# Patient Record
Sex: Female | Born: 1970
Health system: Southern US, Community
[De-identification: ages and names within clinical notes are randomized; demographics above are authoritative.]

## PROBLEM LIST (undated history)

## (undated) DIAGNOSIS — I10 Essential (primary) hypertension: Secondary | ICD-10-CM

## (undated) DIAGNOSIS — Z9889 Other specified postprocedural states: Secondary | ICD-10-CM

## (undated) DIAGNOSIS — E785 Hyperlipidemia, unspecified: Secondary | ICD-10-CM

## (undated) DIAGNOSIS — R112 Nausea with vomiting, unspecified: Secondary | ICD-10-CM

## (undated) DIAGNOSIS — R011 Cardiac murmur, unspecified: Secondary | ICD-10-CM

## (undated) DIAGNOSIS — Z8739 Personal history of other diseases of the musculoskeletal system and connective tissue: Secondary | ICD-10-CM

## (undated) DIAGNOSIS — N200 Calculus of kidney: Secondary | ICD-10-CM

## (undated) HISTORY — PX: NEPHROLITHOTOMY: SUR881

## (undated) HISTORY — DX: Calculus of kidney: N20.0

## (undated) HISTORY — DX: Essential (primary) hypertension: I10

## (undated) HISTORY — DX: Hyperlipidemia, unspecified: E78.5

## (undated) HISTORY — PX: CHOLECYSTECTOMY: SHX55

## (undated) HISTORY — DX: Personal history of other diseases of the musculoskeletal system and connective tissue: Z87.39

## (undated) HISTORY — PX: SHOULDER SURGERY: SHX246

## (undated) HISTORY — PX: PARTIAL HYSTERECTOMY: SHX80

---

## 2009-12-02 DIAGNOSIS — Z90710 Acquired absence of both cervix and uterus: Secondary | ICD-10-CM | POA: Insufficient documentation

## 2010-04-07 DIAGNOSIS — R748 Abnormal levels of other serum enzymes: Secondary | ICD-10-CM | POA: Insufficient documentation

## 2010-04-07 DIAGNOSIS — E781 Pure hyperglyceridemia: Secondary | ICD-10-CM | POA: Insufficient documentation

## 2010-04-30 DIAGNOSIS — E559 Vitamin D deficiency, unspecified: Secondary | ICD-10-CM | POA: Insufficient documentation

## 2010-05-05 DIAGNOSIS — K760 Fatty (change of) liver, not elsewhere classified: Secondary | ICD-10-CM | POA: Insufficient documentation

## 2013-04-17 DIAGNOSIS — M722 Plantar fascial fibromatosis: Secondary | ICD-10-CM | POA: Insufficient documentation

## 2014-07-25 DIAGNOSIS — K219 Gastro-esophageal reflux disease without esophagitis: Secondary | ICD-10-CM | POA: Insufficient documentation

## 2017-10-07 DIAGNOSIS — R87810 Cervical high risk human papillomavirus (HPV) DNA test positive: Secondary | ICD-10-CM | POA: Insufficient documentation

## 2017-10-07 DIAGNOSIS — I10 Essential (primary) hypertension: Secondary | ICD-10-CM | POA: Insufficient documentation

## 2018-02-20 ENCOUNTER — Emergency Department
Admission: EM | Admit: 2018-02-20 | Discharge: 2018-02-20 | Disposition: A | Discharge status: Home / Self Care | Payer: 59 | Attending: Emergency Medicine | Admitting: Emergency Medicine

## 2018-02-20 ENCOUNTER — Other Ambulatory Visit: Payer: Self-pay

## 2018-02-20 DIAGNOSIS — R109 Unspecified abdominal pain: Secondary | ICD-10-CM

## 2018-02-20 DIAGNOSIS — R1032 Left lower quadrant pain: Secondary | ICD-10-CM | POA: Insufficient documentation

## 2018-02-20 DIAGNOSIS — Z9049 Acquired absence of other specified parts of digestive tract: Secondary | ICD-10-CM | POA: Diagnosis not present

## 2018-02-20 DIAGNOSIS — F17200 Nicotine dependence, unspecified, uncomplicated: Secondary | ICD-10-CM | POA: Diagnosis not present

## 2018-02-20 LAB — URINALYSIS, COMPLETE (UACMP) WITH MICROSCOPIC
BILIRUBIN URINE: NEGATIVE
Bacteria, UA: NONE SEEN
GLUCOSE, UA: NEGATIVE mg/dL
HGB URINE DIPSTICK: NEGATIVE
KETONES UR: NEGATIVE mg/dL
NITRITE: NEGATIVE
PH: 6 (ref 5.0–8.0)
Protein, ur: 30 mg/dL — AB
SPECIFIC GRAVITY, URINE: 1.031 — AB (ref 1.005–1.030)

## 2018-02-20 LAB — COMPREHENSIVE METABOLIC PANEL
ALBUMIN: 4 g/dL (ref 3.5–5.0)
ALT: 24 U/L (ref 0–44)
ANION GAP: 8 (ref 5–15)
AST: 16 U/L (ref 15–41)
Alkaline Phosphatase: 70 U/L (ref 38–126)
BILIRUBIN TOTAL: 0.3 mg/dL (ref 0.3–1.2)
BUN: 13 mg/dL (ref 6–20)
CALCIUM: 9 mg/dL (ref 8.9–10.3)
CO2: 28 mmol/L (ref 22–32)
Chloride: 106 mmol/L (ref 98–111)
Creatinine, Ser: 0.54 mg/dL (ref 0.44–1.00)
GFR calc non Af Amer: >60 mL/min (ref >60)
GLUCOSE: 117 mg/dL — AB (ref 70–99)
POTASSIUM: 3.5 mmol/L (ref 3.5–5.1)
Sodium: 142 mmol/L (ref 135–145)
TOTAL PROTEIN: 7.6 g/dL (ref 6.5–8.1)

## 2018-02-20 LAB — CBC WITH DIFFERENTIAL/PLATELET
Abs Immature Granulocytes: 0.02 10*3/uL (ref 0.00–0.07)
BASOS ABS: 0 10*3/uL (ref 0.0–0.1)
Basophils Relative: 0 %
EOS PCT: 2 %
Eosinophils Absolute: 0.1 10*3/uL (ref 0.0–0.5)
HEMATOCRIT: 38.4 % (ref 36.0–46.0)
Hemoglobin: 12.7 g/dL (ref 12.0–15.0)
Immature Granulocytes: 0 %
LYMPHS ABS: 2.3 10*3/uL (ref 0.7–4.0)
Lymphocytes Relative: 38 %
MCH: 30.2 pg (ref 26.0–34.0)
MCHC: 33.1 g/dL (ref 30.0–36.0)
MCV: 91.2 fL (ref 80.0–100.0)
MONO ABS: 0.4 10*3/uL (ref 0.1–1.0)
Monocytes Relative: 6 %
NRBC: 0 % (ref 0.0–0.2)
Neutro Abs: 3.1 10*3/uL (ref 1.7–7.7)
Neutrophils Relative %: 54 %
Platelets: 278 10*3/uL (ref 150–400)
RBC: 4.21 MIL/uL (ref 3.87–5.11)
RDW: 11.9 % (ref 11.5–15.5)
WBC: 5.9 10*3/uL (ref 4.0–10.5)

## 2018-02-20 LAB — POCT PREGNANCY, URINE: Preg Test, Ur: NEGATIVE

## 2018-02-20 MED ORDER — TAMSULOSIN HCL 0.4 MG PO CAPS: 0.4000 mg | ORAL_CAPSULE | Freq: Every day | ORAL | 0 refills | Status: DC

## 2018-02-20 MED ORDER — ONDANSETRON HCL 4 MG PO TABS: 4.0000 mg | ORAL_TABLET | Freq: Three times a day (TID) | ORAL | 0 refills | Status: DC | PRN

## 2018-02-20 MED ORDER — KETOROLAC TROMETHAMINE 60 MG/2ML IM SOLN
60.0000 mg | Freq: Once | INTRAMUSCULAR | Status: AC
  Administered 2018-02-20: 60 mg via INTRAMUSCULAR
  Filled 2018-02-20: qty 2

## 2018-02-20 MED ORDER — KETOROLAC TROMETHAMINE 10 MG PO TABS: 10.0000 mg | ORAL_TABLET | Freq: Three times a day (TID) | ORAL | 0 refills | Status: DC | PRN

## 2018-02-20 NOTE — ED Notes (Signed)

## 2018-02-20 NOTE — Discharge Instructions (Signed)
Please seek medical attention for any high fevers, chest pain, shortness of breath, change in behavior, persistent vomiting, bloody stool or any other new or concerning symptoms.  

## 2018-02-20 NOTE — ED Provider Notes (Signed)
[facility] Emergency Department Provider Note  ____________________________________________   I have reviewed the triage vital signs and the nursing notes.   HISTORY  Chief Complaint Back Pain   History limited by: Not Limited   HPI Caroline Turner is a 47 y.o. female who presents to the emergency department today because of concerns for back pain and left flank pain and concern for possible kidney stone.  Patient states that she had back pain for roughly 1 week.  This had been accompanied by nausea.  More recently the pain has now localized more in the left flank.  She states it does remind her when she has had kidney stones in the past.  The patient denies any fevers or chills with this.  She denies any bad odor to her urine or dysuria.  Per medical record review patient has a history of cholecystectomy  History reviewed. No pertinent past medical history.  There are no active problems to display for this patient.   Past Surgical History:  Procedure Laterality Date  . CHOLECYSTECTOMY      Prior to Admission medications   Not on File    Allergies Patient has no known allergies.  No family history on file.  Social History Social History   Tobacco Use  . Smoking status: Current Every Day Smoker  . Smokeless tobacco: Never Used  Substance Use Topics  . Alcohol use: Yes  . Drug use: Never    Review of Systems Constitutional: No fever/chills Eyes: No visual changes. ENT: No sore throat. Cardiovascular: Denies chest pain. Respiratory: Denies shortness of breath. Gastrointestinal: No abdominal pain. Positive for nausea.  Genitourinary: Negative for dysuria. Musculoskeletal: Positive for back pain. Skin: Negative for rash. Neurological: Negative for headaches, focal weakness or numbness.  ____________________________________________   PHYSICAL EXAM:  VITAL SIGNS: ED Triage Vitals  Enc Vitals Group     BP 02/20/18 1232 (!) 164/110    Pulse Rate 02/20/18 1232 70     Resp 02/20/18 1232 18     Temp 02/20/18 1232 97.6 F (36.4 C)     Temp Source 02/20/18 1605 Oral     SpO2 02/20/18 1232 94 %     Weight 02/20/18 1230 220 lb (99.8 kg)     Height 02/20/18 1230 5\' 3"  (1.6 m)     Head Circumference --      Peak Flow --      Pain Score 02/20/18 1230 10   Constitutional: Alert and oriented.  Eyes: Conjunctivae are normal.  ENT      Head: Normocephalic and atraumatic.      Nose: No congestion/rhinnorhea.      Mouth/Throat: Mucous membranes are moist.      Neck: No stridor. Hematological/Lymphatic/Immunilogical: No cervical lymphadenopathy. Cardiovascular: Normal rate, regular rhythm.  No murmurs, rubs, or gallops.  Respiratory: Normal respiratory effort without tachypnea nor retractions. Breath sounds are clear and equal bilaterally. No wheezes/rales/rhonchi. Gastrointestinal: Soft and non tender. No rebound. No guarding.  Genitourinary: Deferred Musculoskeletal: Normal range of motion in all extremities. No lower extremity edema. Neurologic:  Normal speech and language. No gross focal neurologic deficits are appreciated.  Skin:  Skin is warm, dry and intact. No rash noted. Psychiatric: Mood and affect are normal. Speech and behavior are normal. Patient exhibits appropriate insight and judgment.  ____________________________________________    LABS (pertinent positives/negatives)  UA leukocytes small, rbc 6-10, squamous 21-50 Upreg negative CBC wnl CMP wnl except glu 117  ____________________________________________   EKG  None  ____________________________________________    RADIOLOGY  None  ____________________________________________   PROCEDURES  Procedures  ____________________________________________   INITIAL IMPRESSION / ASSESSMENT AND PLAN / ED COURSE  Pertinent labs & imaging results that were available during my care of the patient were reviewed by me and considered in my medical  decision making (see chart for details).   Patient presented to the emergency department today because of concerns for back pain, left flank pain and possible kidney stone.  Patient states she has a history of kidney stones.  Blood work and urinalysis does show some red blood cells in the urine.  No leukocytosis in the blood.  At this point do think kidney stones are likely.  I had a discussion with the patient.  Given that this reminds her of her previous kidney stones and that she does have some red blood cells in the urine discussed empiric treatment of kidney stones.  Discussed possibility of imaging however patient was comfortable deferring at this time.  Will plan on discharging with medications for kidney stones.  She did feel better after Toradol administered in the emergency department.   ____________________________________________   FINAL CLINICAL IMPRESSION(S) / ED DIAGNOSES  Final diagnoses:  Left flank pain     Note: This dictation was prepared with Dragon dictation. Any transcriptional errors that result from this process are unintentional     Phineas Semen, MD 02/21/18 2003

## 2018-02-20 NOTE — ED Triage Notes (Signed)
Pt c/o back pain and nausea x1 week.

## 2018-02-24 ENCOUNTER — Ambulatory Visit
Admission: RE | Admit: 2018-02-24 | Discharge: 2018-02-24 | Disposition: A | Discharge status: Home / Self Care | Payer: 59 | Source: Ambulatory Visit | Attending: Urology | Admitting: Urology

## 2018-02-24 ENCOUNTER — Ambulatory Visit (INDEPENDENT_AMBULATORY_CARE_PROVIDER_SITE_OTHER): Payer: 59 | Admitting: Urology

## 2018-02-24 ENCOUNTER — Encounter: Payer: Self-pay | Admitting: Urology

## 2018-02-24 VITALS — BP 137/88 | HR 69 | Ht 63.0 in | Wt 225.2 lb

## 2018-02-24 DIAGNOSIS — Z9049 Acquired absence of other specified parts of digestive tract: Secondary | ICD-10-CM | POA: Diagnosis not present

## 2018-02-24 DIAGNOSIS — R109 Unspecified abdominal pain: Secondary | ICD-10-CM

## 2018-02-24 DIAGNOSIS — R350 Frequency of micturition: Secondary | ICD-10-CM | POA: Diagnosis not present

## 2018-02-24 DIAGNOSIS — R310 Gross hematuria: Secondary | ICD-10-CM | POA: Insufficient documentation

## 2018-02-24 DIAGNOSIS — K5730 Diverticulosis of large intestine without perforation or abscess without bleeding: Secondary | ICD-10-CM | POA: Insufficient documentation

## 2018-02-24 DIAGNOSIS — N200 Calculus of kidney: Secondary | ICD-10-CM | POA: Diagnosis not present

## 2018-02-24 LAB — MICROSCOPIC EXAMINATION
RBC MICROSCOPIC, UA: NONE SEEN /HPF (ref 0–2)
WBC UA: NONE SEEN /HPF (ref 0–5)

## 2018-02-24 LAB — URINALYSIS, COMPLETE
BILIRUBIN UA: NEGATIVE
GLUCOSE, UA: NEGATIVE
KETONES UA: NEGATIVE
Leukocytes, UA: NEGATIVE
NITRITE UA: NEGATIVE
Protein, UA: NEGATIVE
RBC, UA: NEGATIVE
SPEC GRAV UA: 1.02 (ref 1.005–1.030)
Urobilinogen, Ur: 0.2 mg/dL (ref 0.2–1.0)
pH, UA: 6 (ref 5.0–7.5)

## 2018-02-24 MED ORDER — IOPAMIDOL (ISOVUE-300) INJECTION 61%
125.0000 mL | Freq: Once | INTRAVENOUS | Status: AC | PRN
  Administered 2018-02-24: 125 mL via INTRAVENOUS

## 2018-02-24 MED ORDER — KETOROLAC TROMETHAMINE 60 MG/2ML IM SOLN
60.0000 mg | Freq: Once | INTRAMUSCULAR | Status: AC
  Administered 2018-02-24: 60 mg via INTRAMUSCULAR

## 2018-02-24 MED ORDER — PROMETHAZINE HCL 25 MG/ML IJ SOLN
25.0000 mg | Freq: Once | INTRAMUSCULAR | Status: AC
  Administered 2018-02-24: 25 mg via INTRAMUSCULAR

## 2018-02-24 MED ORDER — PROMETHAZINE HCL 25 MG PO TABS: 25.0000 mg | ORAL_TABLET | Freq: Four times a day (QID) | ORAL | 0 refills | Status: DC | PRN

## 2018-02-24 MED ORDER — TRAMADOL HCL 50 MG PO TABS: 50.0000 mg | ORAL_TABLET | Freq: Four times a day (QID) | ORAL | 0 refills | Status: DC | PRN

## 2018-02-24 NOTE — Progress Notes (Signed)
02/24/2018 12:49 PM   Caroline Turner Nov 05, 1970 [id number]  Referring provider: No referring provider defined for this encounter.  Chief Complaint  Patient presents with  . Nephrolithiasis  . Follow-up    HPI: Patient is a 47 year old Caucasian female who was referred by [facility] ED for nephrolithiasis.    She presented to [facility] ED on February 12, 2018 with a complaints of left flank pain for 1 week associated with nausea.  No imaging studies were performed.   Labs in the ED: UA positive for 6-10 RBCs and 0-5 WBCs, serum creatinine was 0.54 and WBC count 5.9   Meds given in the ED: Toradol, Zofran and Flomax  Prior urological history:  She has had ureteroscopy x 1   Current NSAID/anticoagulation:  Toradol    Today, she is having left flank pain (8/10), frequency, dysuria, nocturia and blood in the urine.  She is having nausea and vomiting.  Patient denies any gross hematuria, dysuria or suprapubic/flank pain.  Patient denies any fever and chills.  Her UA is negative.    STAT CTU on 02/24/2018 revealed the adrenal glands are unremarkable.  Small bilateral midpole renal calculi but no obstructing ureteral calculi or bladder calculi. Both kidneys demonstrate normal enhancement/perfusion. No worrisome renal lesions. On the delayed images there is a wedge-shaped area of contrast enhancement involving the mid to lower pole region of the left kidney medially.  On the coronal images this appears to be some type of vascular shunt, possibly a venous varix. The delayed images do not demonstrate any significant collecting system abnormalities. Both ureters are normal. The bladder is normal. No asymmetric bladder wall thickening or bladder mass.    PMH: Past Medical History:  Diagnosis Date  . History of swelling of feet   . Hypertension   . Kidney stones     Surgical History: Past Surgical History:  Procedure Laterality Date  . CHOLECYSTECTOMY    . CHOLECYSTECTOMY    .  NEPHROLITHOTOMY    . PARTIAL HYSTERECTOMY    . SHOULDER SURGERY Left     Home Medications:  Allergies as of 02/24/2018   No Known Allergies     Medication List        Accurate as of 02/24/18 12:49 PM. Always use your most recent med list.          hydrochlorothiazide 25 MG tablet Commonly known as:  HYDRODIURIL Take 25 mg by mouth daily.   ketorolac 10 MG tablet Commonly known as:  TORADOL Take 1 tablet (10 mg total) by mouth every 8 (eight) hours as needed for severe pain.   ondansetron 4 MG tablet Commonly known as:  ZOFRAN Take 1 tablet (4 mg total) by mouth every 8 (eight) hours as needed for nausea or vomiting.   promethazine 25 MG tablet Commonly known as:  PHENERGAN Take 1 tablet (25 mg total) by mouth every 6 (six) hours as needed for nausea or vomiting.   tamsulosin 0.4 MG Caps capsule Commonly known as:  FLOMAX Take 1 capsule (0.4 mg total) by mouth daily.   traMADol 50 MG tablet Commonly known as:  ULTRAM Take 1 tablet (50 mg total) by mouth every 6 (six) hours as needed.       Allergies: No Known Allergies  Family History: Family History  Problem Relation Age of Onset  . Cancer Father     Social History:  reports that she has been smoking. She has been smoking about 0.25 packs per day. She has never  used smokeless tobacco. She reports that she drinks about 1.0 standard drinks of alcohol per week. She reports that she does not use drugs.  ROS: UROLOGY Frequent Urination?: Yes Hard to postpone urination?: No Burning/pain with urination?: Yes Get up at night to urinate?: Yes Leakage of urine?: No Urine stream starts and stops?: No Do you have to strain to urinate?: No Blood in urine?: Yes Urinary tract infection?: No Sexually transmitted disease?: No Injury to kidneys or bladder?: No Painful intercourse?: No Weak stream?: No Currently pregnant?: No Vaginal bleeding?: No Last menstrual period?: n  Gastrointestinal Nausea?:  No Vomiting?: Yes Indigestion/heartburn?: No Diarrhea?: Yes Constipation?: No  Constitutional Fever: No Night sweats?: No Weight loss?: No Fatigue?: No  Skin Skin rash/lesions?: No Itching?: No  Eyes Blurred vision?: No Double vision?: No  Ears/Nose/Throat Sore throat?: No Sinus problems?: No  Hematologic/Lymphatic Swollen glands?: No Easy bruising?: No  Cardiovascular Leg swelling?: No Chest pain?: No  Respiratory Cough?: No Shortness of breath?: No  Endocrine Excessive thirst?: No  Musculoskeletal Back pain?: No Joint pain?: No  Neurological Headaches?: No Dizziness?: No  Psychologic Depression?: No Anxiety?: No  Physical Exam: BP 137/88 (BP Location: Left Arm, Patient Position: Sitting, Cuff Size: Large)   Pulse 69   Ht 5\' 3"  (1.6 m)   Wt 225 lb 3.2 oz (102.2 kg)   BMI 39.89 kg/m   Constitutional:  Well nourished. Alert and oriented, No acute distress.  Writhing in pain. HEENT: [place] AT, moist mucus membranes.  Trachea midline, no masses. Cardiovascular: No clubbing, cyanosis, or edema. Respiratory: Normal respiratory effort, no increased work of breathing. GI: Abdomen is soft, non tender, non distended, no abdominal masses. Liver and spleen not palpable.  No hernias appreciated.  Stool sample for occult testing is not indicated.   GU: No CVA tenderness.  No bladder fullness or masses.   Skin: No rashes, bruises or suspicious lesions. Lymph: No cervical or inguinal adenopathy. Neurologic: Grossly intact, no focal deficits, moving all 4 extremities. Psychiatric: Normal mood and affect.  Laboratory Data: Lab Results  Component Value Date   WBC 5.9 02/20/2018   HGB 12.7 02/20/2018   HCT 38.4 02/20/2018   MCV 91.2 02/20/2018   PLT 278 02/20/2018    Lab Results  Component Value Date   CREATININE 0.54 02/20/2018    No results found for: PSA  No results found for: TESTOSTERONE  No results found for: HGBA1C  No results found for:  TSH  No results found for: CHOL, HDL, CHOLHDL, VLDL, LDLCALC  Lab Results  Component Value Date   AST 16 02/20/2018   Lab Results  Component Value Date   ALT 24 02/20/2018   No components found for: ALKALINEPHOPHATASE No components found for: BILIRUBINTOTAL  No results found for: ESTRADIOL  Urinalysis    Component Value Date/Time   COLORURINE YELLOW (A) 02/20/2018 1601   APPEARANCEUR HAZY (A) 02/20/2018 1601   LABSPEC 1.031 (H) 02/20/2018 1601   PHURINE 6.0 02/20/2018 1601   GLUCOSEU NEGATIVE 02/20/2018 1601   HGBUR NEGATIVE 02/20/2018 1601   BILIRUBINUR NEGATIVE 02/20/2018 1601   KETONESUR NEGATIVE 02/20/2018 1601   PROTEINUR 30 (A) 02/20/2018 1601   NITRITE NEGATIVE 02/20/2018 1601   LEUKOCYTESUR SMALL (A) 02/20/2018 1601   I have reviewed the labs.   Pertinent Imaging: CLINICAL DATA:  Left flank pain, frequent urination and microscopic hematuria for 1 week.  EXAM: CT ABDOMEN AND PELVIS WITHOUT AND WITH CONTRAST  TECHNIQUE: Multidetector CT imaging of the abdomen and pelvis  was performed following the standard protocol before and following the bolus administration of intravenous contrast.  CONTRAST:  ISOVUE-300 IOPAMIDOL (ISOVUE-300) INJECTION 61%  COMPARISON:  None.  FINDINGS: Lower chest: The lung bases are clear. No worrisome pulmonary lesions. The heart is normal in size. No pericardial effusion. The distal esophagus is grossly normal.  Hepatobiliary: No focal hepatic lesions or intrahepatic biliary dilatation. The gallbladder is surgically absent. No common bile duct dilatation.  Pancreas: No mass, inflammation or ductal dilatation.  Spleen: Normal size.  No focal lesions.  Adrenals/Urinary Tract: The adrenal glands are unremarkable.  Small bilateral midpole renal calculi but no obstructing ureteral calculi or bladder calculi. Both kidneys demonstrate normal enhancement/perfusion. No worrisome renal lesions. On the  delayed images there is a wedge-shaped area of contrast enhancement involving the mid to lower pole region of the left kidney medially. On the coronal images this appears to be some type of vascular shunt, possibly a venous varix. The delayed images do not demonstrate any significant collecting system abnormalities. Both ureters are normal. The bladder is normal. No asymmetric bladder wall thickening or bladder mass.  Stomach/Bowel: The stomach, duodenum, small bowel and colon are grossly normal without oral contrast. No inflammatory changes, mass lesions or obstructive findings. The terminal ileum and appendix are normal. Moderate descending and sigmoid colon diverticulosis but no findings for acute diverticulitis.  Vascular/Lymphatic: The aorta is normal in caliber. No dissection. The branch vessels are patent. The major venous structures are patent. No mesenteric or retroperitoneal mass or adenopathy. Small scattered lymph nodes are noted.  Reproductive: The uterus is surgically absent. Both ovaries are still present and appear normal.  Other: No pelvic mass or adenopathy. No free pelvic fluid collections. No inguinal mass or adenopathy. No abdominal wall hernia or subcutaneous lesions.  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 No significant bony findings.  IMPRESSION: 1. Small bilateral renal calculi may account for the patient's microhematuria. No obstructing ureteral calculi or bladder calculi. 2. No worrisome renal or bladder lesions. There does appear to be some type of vascular shunt lesion involving the lower pole region of the left kidney as detailed above 3. No acute abdominal/pelvic findings, mass lesions or adenopathy. 4. Status post cholecystectomy.  No biliary dilatation. 5. Descending and sigmoid colon diverticulosis without findings for acute diverticulitis. 6.  Benign-appearing fatty mass in the left quadriceps compartment.   Electronically Signed   By: Rudie Meyer M.D.   On: 02/24/2018 16:27  I have independently reviewed the films with Dr. Lonna Cobb, Dr. Richardo Hanks and Dr. Pecolia Ades and no etiology for her flank pain is evident on scan.    Assessment & Plan:    1. Gross hematuria I explained to the patient that there are a number of causes that can be associated with blood in the urine, such as stones, UTI's, damage to the urinary tract and/or cancer -it is likely due to nephrolithiasis as she has a past history of it and she states it feels similar to other stone episodes We did discuss undergoing a CT scan without contrast versus the CT urogram, but I explained to her in the office chance the CT renal stone study did not demonstrate urethral lithiasis we would need to pursue the CT urogram She states that she would like to have the CT urogram at this time I explained to the patient that a contrast material will be injected into a vein and that in rare  instances, an allergic reaction can result and may even life threatening   The patient denies any allergies to contrast, iodine and/or seafood and is not taking metformin The patient will return following all of the above for discussion of the results.  Urine culture Urinalysis, Complete CTU on 02/24/2018 revealed Small bilateral renal calculi may account for the patient's microhematuria. No obstructing ureteral calculi or bladder calculi.   No worrisome renal or bladder lesions. There does appear to be some type of vascular shunt lesion involving the lower pole region of the left kidney as detailed above  No acute abdominal/pelvic findings, mass lesions or adenopathy.  Status post cholecystectomy.  No biliary dilatation.  Descending and sigmoid colon diverticulosis without findings for acute diverticulitis.  Benign-appearing fatty mass in the left quadriceps compartment -reviewed with Dr. Richardo Hanks, Dr.  Lonna Cobb and Dr. Pecolia Ades.  Also referred on to Dr. Wyn Quaker.  This area is strictly an incidental finding and no further follow up is warranted and it is no the cause of her pain She will need cysto to complete hematuria work up  2. Left flank pain Suggested that patient return to the emergency room as she is in intense pain, she stated that she did not want to return and wanted to manage the pain at home Given Toradol 60 mg IM here in the office Given a prescription for Tramadol 50 mg, q 6 prn for pain  Given a prescription for Phenergan 25 mg, every 4 as needed for nausea Patient is advised that if they should start to experience pain that is not able to be controlled with pain medication, intractable nausea and/or vomiting and/or fevers greater than 103 or shaking chills to contact the office immediately or seek treatment in the emergency department for emergent intervention.   Vascular shunt is not the cause of her pain -referred on to PCP for further evaluation    Return for I will call patient with results.  These notes generated with voice recognition software. I apologize for typographical errors.  Michiel Cowboy, PA-C  [facility] [address]  [address] [place], [place] [address] [phone]

## 2018-02-27 ENCOUNTER — Telehealth: Payer: Self-pay | Admitting: Urology

## 2018-02-27 ENCOUNTER — Telehealth: Payer: Self-pay

## 2018-02-27 LAB — CULTURE, URINE COMPREHENSIVE

## 2018-02-27 NOTE — Telephone Encounter (Signed)
Patient notified and voiced understanding.

## 2018-02-27 NOTE — Telephone Encounter (Signed)
Would you please call the patient and let her know that [facility] and [facility] are accepting new patients?  She is in need of a PCP.

## 2018-02-27 NOTE — Telephone Encounter (Signed)
Pt called office asking for her CT results she had done on Friday afternoon, pt states she is in a lot of pain and wants to know what's going on and what she should do. Please advise pt at [phone].

## 2018-02-27 NOTE — Telephone Encounter (Signed)
Patient notified

## 2018-02-27 NOTE — Telephone Encounter (Signed)
Spoke with Dr. Pecolia Ades regarding the scan and he feels it is not the cause of her left flank pain and is a benign finding.

## 2018-02-27 NOTE — Telephone Encounter (Signed)
-----   Message from Harle Battiest, PA-C sent at 02/27/2018  8:14 AM EST ----- Please call Caroline Turner and let her know that her CT scan showed small bilateral stones, but no obstruction.  Is she still having pain?

## 2018-02-27 NOTE — Telephone Encounter (Signed)
Please let Caroline Turner that she only has stones in her kidneys, but there are none in her ureters.  We are reviewing it with the physicians to see if there is something else causing her pain.

## 2018-02-27 NOTE — Telephone Encounter (Signed)
I have spoken with the patient concerning the status of the wedge are in her left kidney.  I explained to her that 2 physicians (Dr. Richardo Hanks and Dr. Lonna Cobb) have examined the films and are not convinced that this is the etiology of her pain but it will need some sort of follow-up.  I explained to her that I have put a message out to another physician (Dr. Apolinar Junes) to seek their opinion and will contact her when I hear back from them.  In the interim I encouraged her to seek care with primary care as her back pain may be muscle skeletal in nature and not urological.

## 2018-02-27 NOTE — Telephone Encounter (Signed)
Would you please let Mrs. Caroline Turner know that I have not yet heard from the other surgeon, but they have been in surgery all day and may not hear from them until tomorrow?  I did speak to the radiologist that read her CT and doesn't believe the area in her kidney is causing the pain or anything to be concerned about.

## 2018-02-27 NOTE — Telephone Encounter (Signed)
Patient notified still having pain, see phone encounter

## 2018-02-28 ENCOUNTER — Telehealth: Payer: Self-pay | Admitting: Urology

## 2018-02-28 DIAGNOSIS — R1084 Generalized abdominal pain: Secondary | ICD-10-CM | POA: Diagnosis not present

## 2018-02-28 DIAGNOSIS — M545 Low back pain: Secondary | ICD-10-CM | POA: Diagnosis not present

## 2018-02-28 DIAGNOSIS — N200 Calculus of kidney: Secondary | ICD-10-CM | POA: Diagnosis not present

## 2018-02-28 DIAGNOSIS — Q639 Congenital malformation of kidney, unspecified: Secondary | ICD-10-CM

## 2018-02-28 NOTE — Telephone Encounter (Signed)
Please let Caroline Turner know that I am going to refer her to vascular so we can get there opinion regarding this area in her left kidney.  She will need a cystoscopy scheduled at some point to complete her hematuria workup.

## 2018-03-10 ENCOUNTER — Encounter (INDEPENDENT_AMBULATORY_CARE_PROVIDER_SITE_OTHER): Payer: Self-pay | Admitting: Vascular Surgery

## 2018-03-10 ENCOUNTER — Encounter

## 2018-03-10 ENCOUNTER — Ambulatory Visit (INDEPENDENT_AMBULATORY_CARE_PROVIDER_SITE_OTHER): Payer: 59 | Admitting: Vascular Surgery

## 2018-03-10 VITALS — BP 171/91 | HR 90 | Resp 17 | Ht 63.0 in | Wt 221.0 lb

## 2018-03-10 DIAGNOSIS — F17210 Nicotine dependence, cigarettes, uncomplicated: Secondary | ICD-10-CM | POA: Diagnosis not present

## 2018-03-10 DIAGNOSIS — N200 Calculus of kidney: Secondary | ICD-10-CM

## 2018-03-10 DIAGNOSIS — I10 Essential (primary) hypertension: Secondary | ICD-10-CM

## 2018-03-10 DIAGNOSIS — Q279 Congenital malformation of peripheral vascular system, unspecified: Secondary | ICD-10-CM | POA: Diagnosis not present

## 2018-03-10 NOTE — Assessment & Plan Note (Signed)
I have independently reviewed the CT scan and would generally agree with the radiologist interpretation as listed below.  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  This is very small and not clinically important.  This is unlikely to be the cause of her pain and is likely a truly incidental finding.  She does have any significant atherosclerosis of the aorta or the renal arteries.  I see no obvious sign of a AV malformation or poor perfusion to the kidney.  Clearly, in this instance treatment of the disease would be more harmful than the actual disease process itself and no intervention would be recommended.  I will see her back as needed.

## 2018-03-10 NOTE — Assessment & Plan Note (Signed)
I think this is much more likely the cause of her symptoms than the minor venous abnormality seen on the CT scan.

## 2018-03-10 NOTE — Assessment & Plan Note (Signed)
blood pressure control important in reducing the progression of atherosclerotic disease. On appropriate oral medications.  

## 2018-03-10 NOTE — Progress Notes (Signed)
Patient ID: Elayah Klooster, female   DOB: 1970-09-09, 47 y.o.   MRN: [id number]  Chief Complaint  Patient presents with  . New Patient (Initial Visit)    Renal Anomaly    HPI Miabella Shannahan is a 47 y.o. female.  I am asked to see the patient by Rulon Sera PA-C for evaluation of left renal abnormality seen on a recent CT scan.  The patient reports pain in the flank.  She was found to have kidney stones on the studies.  The pain is mostly in her flank and lower back.  She denies fevers or chills.  No history of kidney dysfunction to her knowledge. I have independently reviewed the CT scan and would generally agree with the radiologist interpretation as listed below.  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  This is very small and not clinically important.  This is unlikely to be the cause of her pain and is likely a truly incidental finding.  She does have any significant atherosclerosis of the aorta or the renal arteries.  I see no obvious sign of a AV malformation or poor perfusion to the kidney.   Past Medical History:  Diagnosis Date  . History of swelling of feet   . Hypertension   . Kidney stones     Past Surgical History:  Procedure Laterality Date  . CHOLECYSTECTOMY    . CHOLECYSTECTOMY    . NEPHROLITHOTOMY    . PARTIAL HYSTERECTOMY    . SHOULDER SURGERY Left     Family History  Problem Relation Age of Onset  . Cancer Father   No bleeding or clotting disorders  Social History Social History   Tobacco Use  . Smoking status: Current Every Day Smoker    Packs/day: 0.25  . Smokeless tobacco: Never Used  . Tobacco comment: 5 cigarettes a week  Substance Use Topics  . Alcohol use: Yes    Alcohol/week: 1.0 standard drinks    Types: 1 Cans of beer per week    Comment: occasionally  . Drug use: Never     No Known Allergies  Current Outpatient Medications  Medication Sig Dispense Refill    . hydrochlorothiazide (HYDRODIURIL) 25 MG tablet Take 25 mg by mouth daily.    Marland Kitchen HYDROcodone-acetaminophen (NORCO/VICODIN) 5-325 MG tablet TK 1 T PO Q 6 H  0  . ketorolac (TORADOL) 10 MG tablet Take 1 tablet (10 mg total) by mouth every 8 (eight) hours as needed for severe pain. (Patient not taking: Reported on 03/10/2018) 20 tablet 0  . meloxicam (MOBIC) 7.5 MG tablet TK 1 TO 2 TS PO QD  1  . ondansetron (ZOFRAN) 4 MG tablet Take 1 tablet (4 mg total) by mouth every 8 (eight) hours as needed for nausea or vomiting. (Patient not taking: Reported on 02/24/2018) 20 tablet 0  . promethazine (PHENERGAN) 25 MG tablet Take 1 tablet (25 mg total) by mouth every 6 (six) hours as needed for nausea or vomiting. (Patient not taking: Reported on 03/10/2018) 30 tablet 0  . tamsulosin (FLOMAX) 0.4 MG CAPS capsule Take 1 capsule (0.4 mg total) by mouth daily. (Patient not taking: Reported on 03/10/2018) 14 capsule 0  . traMADol (ULTRAM) 50 MG tablet Take 1 tablet (50 mg total) by mouth every 6 (six) hours as needed. (Patient not taking: Reported on 03/10/2018) 30 tablet 0   No current facility-administered medications for this visit.  REVIEW OF SYSTEMS (Negative unless checked)  Constitutional: '[]' Weight loss  '[]' Fever  '[]' Chills Cardiac: '[]' Chest pain   '[]' Chest pressure   '[]' Palpitations   '[]' Shortness of breath when laying flat   '[]' Shortness of breath at rest   '[]' Shortness of breath with exertion. Vascular:  '[]' Pain in legs with walking   '[]' Pain in legs at rest   '[]' Pain in legs when laying flat   '[]' Claudication   '[]' Pain in feet when walking  '[]' Pain in feet at rest  '[]' Pain in feet when laying flat   '[]' History of DVT   '[]' Phlebitis   '[]' Swelling in legs   '[]' Varicose veins   '[]' Non-healing ulcers Pulmonary:   '[]' Uses home oxygen   '[]' Productive cough   '[]' Hemoptysis   '[]' Wheeze  '[]' COPD   '[]' Asthma Neurologic:  '[]' Dizziness  '[]' Blackouts   '[]' Seizures   '[]' History of stroke   '[]' History of TIA  '[]' Aphasia   '[]' Temporary  blindness   '[]' Dysphagia   '[]' Weakness or numbness in arms   '[]' Weakness or numbness in legs Musculoskeletal:  '[]' Arthritis   '[]' Joint swelling   '[]' Joint pain   '[]' Low back pain Hematologic:  '[]' Easy bruising  '[]' Easy bleeding   '[]' Hypercoagulable state   '[]' Anemic  '[]' Hepatitis Gastrointestinal:  '[]' Blood in stool   '[]' Vomiting blood  '[]' Gastroesophageal reflux/heartburn   '[x]' Abdominal pain Genitourinary:  '[]' Chronic kidney disease   '[x]' Difficult urination  '[]' Frequent urination  '[]' Burning with urination   '[x]' Hematuria Skin:  '[]' Rashes   '[]' Ulcers   '[]' Wounds Psychological:  '[]' History of anxiety   '[]'  History of major depression.    Physical Exam BP (!) 171/91 (BP Location: Right Arm, Patient Position: Sitting)   Pulse 90   Resp 17   Ht '5\' 3"'  (1.6 m)   Wt 221 lb (100.2 kg)   BMI 39.15 kg/m  Gen:  WD/WN, NAD Head: [place]/AT, No temporalis wasting.  Ear/Nose/Throat: Hearing grossly intact, nares w/o erythema or drainage, oropharynx w/o Erythema/Exudate Eyes: Conjunctiva clear, sclera non-icteric  Neck: trachea midline.   Pulmonary:  Good air movement, aspirations not labored, no use of accessory muscles Cardiac: RRR, no JVD Vascular: Vessel Right Left  Radial Palpable Palpable                                   Gastrointestinal: soft, non-tender/non-distended Musculoskeletal: M/S 5/5 throughout.  Extremities without ischemic changes.  No deformity or atrophy.  No edema. Neurologic: Sensation grossly intact in extremities.  Symmetrical.  Speech is fluent. Motor exam as listed above. Psychiatric: Judgment intact, Mood & affect appropriate for pt's clinical situation. Dermatologic: No rashes or ulcers noted.  No cellulitis or open wounds.   Radiology Ct Hematuria Workup  Result Date: 02/24/2018 CLINICAL DATA:  Left flank pain, frequent urination and microscopic hematuria for 1 week. EXAM: CT ABDOMEN AND PELVIS WITHOUT AND WITH CONTRAST TECHNIQUE: Multidetector CT imaging of the abdomen and pelvis  was performed following the standard protocol before and following the bolus administration of intravenous contrast. CONTRAST:  121m ISOVUE-300 IOPAMIDOL (ISOVUE-300) INJECTION 61% COMPARISON:  None. FINDINGS: Lower chest: The lung bases are clear. No worrisome pulmonary lesions. The heart is normal in size. No pericardial effusion. The distal esophagus is grossly normal. Hepatobiliary: No focal hepatic lesions or intrahepatic biliary dilatation. The gallbladder is surgically absent. No common bile duct dilatation. Pancreas: No mass, inflammation or ductal dilatation. Spleen: Normal size.  No focal lesions. Adrenals/Urinary Tract: The adrenal glands are unremarkable. Small bilateral midpole renal calculi but  no obstructing ureteral calculi or bladder calculi. Both kidneys demonstrate normal enhancement/perfusion. No worrisome renal lesions. On the delayed images there is a wedge-shaped area of contrast enhancement involving the mid to lower pole region of the left kidney medially. On the coronal images this appears to be some type of vascular shunt, possibly a venous varix. The delayed images do not demonstrate any significant collecting system abnormalities. Both ureters are normal. The bladder is normal. No asymmetric bladder wall thickening or bladder mass. Stomach/Bowel: The stomach, duodenum, small bowel and colon are grossly normal without oral contrast. No inflammatory changes, mass lesions or obstructive findings. The terminal ileum and appendix are normal. Moderate descending and sigmoid colon diverticulosis but no findings for acute diverticulitis. Vascular/Lymphatic: The aorta is normal in caliber. No dissection. The branch vessels are patent. The major venous structures are patent. No mesenteric or retroperitoneal mass or adenopathy. Small scattered lymph nodes are noted. Reproductive: The uterus is surgically absent. Both ovaries are still present and appear normal. Other: No pelvic mass or adenopathy.  No free pelvic fluid collections. No inguinal mass or adenopathy. No abdominal wall hernia or subcutaneous lesions. 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 No significant bony findings. IMPRESSION: 1. Small bilateral renal calculi may account for the patient's microhematuria. No obstructing ureteral calculi or bladder calculi. 2. No worrisome renal or bladder lesions. There does appear to be some type of vascular shunt lesion involving the lower pole region of the left kidney as detailed above 3. No acute abdominal/pelvic findings, mass lesions or adenopathy. 4. Status post cholecystectomy.  No biliary dilatation. 5. Descending and sigmoid colon diverticulosis without findings for acute diverticulitis. 6. Benign-appearing fatty mass in the left quadriceps compartment. Electronically Signed   By: Marijo Sanes M.D.   On: 02/24/2018 16:27    Labs Recent Results (from the past 2160 hour(s))  CBC with Differential/Platelet     Status: None   Collection Time: 02/20/18  4:01 PM  Result Value Ref Range   WBC 5.9 4.0 - 10.5 K/uL   RBC 4.21 3.87 - 5.11 MIL/uL   Hemoglobin 12.7 12.0 - 15.0 g/dL   HCT 38.4 36.0 - 46.0 %   MCV 91.2 80.0 - 100.0 fL   MCH 30.2 26.0 - 34.0 pg   MCHC 33.1 30.0 - 36.0 g/dL   RDW 11.9 11.5 - 15.5 %   Platelets 278 150 - 400 K/uL   nRBC 0.0 0.0 - 0.2 %   Neutrophils Relative % 54 %   Neutro Abs 3.1 1.7 - 7.7 K/uL   Lymphocytes Relative 38 %   Lymphs Abs 2.3 0.7 - 4.0 K/uL   Monocytes Relative 6 %   Monocytes Absolute 0.4 0.1 - 1.0 K/uL   Eosinophils Relative 2 %   Eosinophils Absolute 0.1 0.0 - 0.5 K/uL   Basophils Relative 0 %   Basophils Absolute 0.0 0.0 - 0.1 K/uL   Immature Granulocytes 0 %   Abs Immature Granulocytes 0.02 0.00 - 0.07 K/uL    Comment: Performed at [facility], [address] [place], [place] [address]  Comprehensive metabolic panel     Status: Abnormal    Collection Time: 02/20/18  4:01 PM  Result Value Ref Range   Sodium 142 135 - 145 mmol/L   Potassium 3.5 3.5 - 5.1 mmol/L   Chloride 106 98 - 111 mmol/L   CO2 28 22 - 32 mmol/L   Glucose, Bld 117 (  H) 70 - 99 mg/dL   BUN 13 6 - 20 mg/dL   Creatinine, Ser 0.54 0.44 - 1.00 mg/dL   Calcium 9.0 8.9 - 10.3 mg/dL   Total Protein 7.6 6.5 - 8.1 g/dL   Albumin 4.0 3.5 - 5.0 g/dL   AST 16 15 - 41 U/L   ALT 24 0 - 44 U/L   Alkaline Phosphatase 70 38 - 126 U/L   Total Bilirubin 0.3 0.3 - 1.2 mg/dL   GFR calc non Af Amer >60 >60 mL/min   GFR calc Af Amer >60 >60 mL/min    Comment: (NOTE) The eGFR has been calculated using the CKD EPI equation. This calculation has not been validated in all clinical situations. eGFR's persistently <60 mL/min signify possible Chronic Kidney Disease.    Anion gap 8 5 - 15    Comment: Performed at [facility], [address] [place], [place] [address]  Urinalysis, Complete w Microscopic     Status: Abnormal   Collection Time: 02/20/18  4:01 PM  Result Value Ref Range   Color, Urine YELLOW (A) YELLOW   APPearance HAZY (A) CLEAR   Specific Gravity, Urine 1.031 (H) 1.005 - 1.030   pH 6.0 5.0 - 8.0   Glucose, UA NEGATIVE NEGATIVE mg/dL   Hgb urine dipstick NEGATIVE NEGATIVE   Bilirubin Urine NEGATIVE NEGATIVE   Ketones, ur NEGATIVE NEGATIVE mg/dL   Protein, ur 30 (A) NEGATIVE mg/dL   Nitrite NEGATIVE NEGATIVE   Leukocytes, UA SMALL (A) NEGATIVE   RBC / HPF 6-10 0 - 5 RBC/hpf   WBC, UA 0-5 0 - 5 WBC/hpf   Bacteria, UA NONE SEEN NONE SEEN   Squamous Epithelial / LPF 21-50 0 - 5   Mucus PRESENT     Comment: Performed at [facility], [address] [place], [place] [address]  Pregnancy, urine POC     Status: None   Collection Time: 02/20/18  4:09 PM  Result Value Ref Range   Preg Test, Ur NEGATIVE NEGATIVE    Comment:        THE SENSITIVITY OF THIS METHODOLOGY IS >24 mIU/mL   Urinalysis, Complete     Status: None   Collection  Time: 02/24/18 12:48 PM  Result Value Ref Range   Specific Gravity, UA 1.020 1.005 - 1.030   pH, UA 6.0 5.0 - 7.5   Color, UA Yellow Yellow   Appearance Ur Clear Clear   Leukocytes, UA Negative Negative   Protein, UA Negative Negative/Trace   Glucose, UA Negative Negative   Ketones, UA Negative Negative   RBC, UA Negative Negative   Bilirubin, UA Negative Negative   Urobilinogen, Ur 0.2 0.2 - 1.0 mg/dL   Nitrite, UA Negative Negative   Microscopic Examination See below:   Microscopic Examination     Status: Abnormal   Collection Time: 02/24/18 12:48 PM  Result Value Ref Range   WBC, UA None seen 0 - 5 /hpf   RBC, UA None seen 0 - 2 /hpf   Epithelial Cells (non renal) 0-10 0 - 10 /hpf   Mucus, UA Present (A) Not Estab.   Bacteria, UA Few (A) None seen/Few  CULTURE, URINE COMPREHENSIVE     Status: None   Collection Time: 02/24/18  2:48 PM  Result Value Ref Range   Urine Culture, Comprehensive Final report    Organism ID, Bacteria Comment     Comment: Mixed urogenital flora 10,000-25,000 colony forming units per mL  Assessment/Plan:  Kidney stones I think this is much more likely the cause of her symptoms than the minor venous abnormality seen on the CT scan.  Hypertension blood pressure control important in reducing the progression of atherosclerotic disease. On appropriate oral medications.   Congenital malformation of left renal vein I have independently reviewed the CT scan and would generally agree with the radiologist interpretation as listed below.  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  This is very small and not clinically important.  This is unlikely to be the cause of her pain and is likely a truly incidental finding.  She does have any significant atherosclerosis of the aorta or the renal arteries.  I see no obvious sign of a AV malformation or poor perfusion to the kidney.   Clearly, in this instance treatment of the disease would be more harmful than the actual disease process itself and no intervention would be recommended.  I will see her back as needed.      Leotis Pain 03/10/2018, 12:02 PM   This note was created with Dragon medical transcription system.  Any errors from dictation are unintentional.

## 2018-03-13 ENCOUNTER — Telehealth: Payer: Self-pay | Admitting: Urology

## 2018-03-13 NOTE — Telephone Encounter (Signed)
Kathie RhodesBetty needs to be scheduled for a cystoscopy for hematuria.

## 2018-03-14 ENCOUNTER — Encounter (INDEPENDENT_AMBULATORY_CARE_PROVIDER_SITE_OTHER): Payer: Self-pay | Admitting: Vascular Surgery

## 2018-03-14 NOTE — Telephone Encounter (Signed)
App made and lm on vm to cb to confirm   Mailed   mb

## 2018-03-17 ENCOUNTER — Encounter (INDEPENDENT_AMBULATORY_CARE_PROVIDER_SITE_OTHER): Payer: Self-pay | Admitting: Vascular Surgery

## 2018-03-30 ENCOUNTER — Other Ambulatory Visit: Payer: 59 | Admitting: Urology

## 2018-03-30 ENCOUNTER — Encounter: Payer: Self-pay | Admitting: Urology

## 2018-03-30 NOTE — Progress Notes (Incomplete)
° °  03/30/2018   CC: No chief complaint on file.   HPI: Remigio EisenmengerBetty Gianfrancesco is a 47 yo F who returns today for a cystoscopy for the evaluation and management of hematuria. Her last visit with Michiel CowboyShannon McGowan PA-C was on 02/24/2018.   There were no vitals taken for this visit. NED. A&Ox3.   No respiratory distress   Abd soft, NT, ND Normal external genitalia with patent urethral meatus  Cystoscopy Procedure Note  Patient identification was confirmed, informed consent was obtained, and patient was prepped using Betadine solution.  Lidocaine jelly was administered per urethral meatus.    Procedure: - Flexible cystoscope introduced, without any difficulty.   - Thorough search of the bladder revealed:    normal urethral meatus    normal urothelium    no stones    no ulcers     no tumors    no urethral polyps    no trabeculation  - Ureteral orifices were normal in position and appearance.  Post-Procedure: - Patient tolerated the procedure well  Assessment/ Plan: 1. Gross Hematuria  -***  No follow-ups on file.  Letitia NeriNethusan Sivanesan  I, Nethusan Sivanesan, am acting as a Neurosurgeonscribe for Dr. Lorin PicketScott C. Stoioff,  {Add Holiday representativecribe Attestation Statement}

## 2018-04-11 ENCOUNTER — Telehealth: Payer: Self-pay | Admitting: Urology

## 2018-04-11 NOTE — Telephone Encounter (Signed)
Please reschedule Caroline Turner's cystoscopy.  She may not have know about the appointment that she missed as it was left on her voice mail.

## 2018-04-13 NOTE — Telephone Encounter (Signed)
App made And mailed to patient  Caroline Turner

## 2018-05-02 ENCOUNTER — Other Ambulatory Visit: Payer: Self-pay

## 2018-05-02 ENCOUNTER — Ambulatory Visit (INDEPENDENT_AMBULATORY_CARE_PROVIDER_SITE_OTHER): Payer: 59 | Admitting: Nurse Practitioner

## 2018-05-02 ENCOUNTER — Encounter: Payer: Self-pay | Admitting: Nurse Practitioner

## 2018-05-02 VITALS — BP 138/76 | HR 69 | Temp 98.1°F | Ht 63.0 in | Wt 225.6 lb

## 2018-05-02 DIAGNOSIS — I10 Essential (primary) hypertension: Secondary | ICD-10-CM | POA: Diagnosis not present

## 2018-05-02 DIAGNOSIS — I499 Cardiac arrhythmia, unspecified: Secondary | ICD-10-CM | POA: Diagnosis not present

## 2018-05-02 DIAGNOSIS — G43509 Persistent migraine aura without cerebral infarction, not intractable, without status migrainosus: Secondary | ICD-10-CM | POA: Diagnosis not present

## 2018-05-02 DIAGNOSIS — Z7689 Persons encountering health services in other specified circumstances: Secondary | ICD-10-CM

## 2018-05-02 MED ORDER — SUMATRIPTAN SUCCINATE 25 MG PO TABS: 25.0000 mg | ORAL_TABLET | ORAL | 0 refills | Status: DC | PRN

## 2018-05-02 MED ORDER — HYDROCHLOROTHIAZIDE 25 MG PO TABS: 25.0000 mg | ORAL_TABLET | Freq: Every day | ORAL | 1 refills | Status: DC

## 2018-05-02 NOTE — Patient Instructions (Addendum)
Caroline Turner,   Thank you for coming in to clinic today.  1. For your headache: START sumatriptan 25 mg one tablet every 2 hours for 3 doses max prn migraine or severe tension-type headache.  2. For your lump: - this is most likely a sebaceous cyst.   It is not currently infected and does not require antibiotics.  If it remains painful, you can have it removed at dermatology.    Please schedule a follow-up appointment with Wilhelmina Mcardle, AGNP. Return in about 3 months (around 08/01/2018) for annual physical AND as needed for repeat headaches.  If you have any other questions or concerns, please feel free to call the clinic or send a message through MyChart. You may also schedule an earlier appointment if necessary.  You will receive a survey after today's visit either digitally by e-mail or paper by [employer]. Your experiences and feedback matter to [place].  Please respond so we know how we are doing as we provide care for you.   Wilhelmina Mcardle, DNP, AGNP-BC Adult Gerontology Nurse Practitioner [facility], [facility]

## 2018-05-02 NOTE — Progress Notes (Signed)
Subjective:    Patient ID: Caroline Turner, female    DOB: July 17, 1970, 48 y.o.   MRN: [id number]  Caroline Turner is a 48 y.o. female presenting on 05/02/2018 for Establish Care (lesion on the left side of the occiptal lobe thats tender to the touch x 3 days )   HPI Establish Care New Provider Pt last seen by PCP at [facility] in [place], [place] 09/2017 for wellness visit.  Obtain records from Care Everywhere.   - Patient has also had urology visits Michiel Cowboy) and vascular surgery (Dr. Festus Barren) visits since moving to [facility] available in [facility].  Lesion on head Onset of lesion on back of head 3 days ago that is tender.  Patient requests evaluation today. Soft initially, but is getting more firm.  Having more headache and head pressure.  Tender to pressure.  Also has nausea.  No vomiting. Patient notes new headache with this tenderness x 3 days.  Headache is associated with photophobia, phonophobia, nausea.  Patient denies changes in vision.    Hypertension - She is not checking BP at home or outside of clinic.   Patient takes HCTZ for lower leg swelling.  Does not take daily.  Patient states she doesn't need to take daily for blood pressure.  Past Medical History:  Diagnosis Date  . History of swelling of feet   . Hyperlipidemia   . Hypertension   . Kidney stones    Past Surgical History:  Procedure Laterality Date  . CHOLECYSTECTOMY    . CHOLECYSTECTOMY    . NEPHROLITHOTOMY    . PARTIAL HYSTERECTOMY    . SHOULDER SURGERY Left    Social History   Socioeconomic History  . Marital status: Married    Spouse name: Not on file  . Number of children: 3  . Years of education: Not on file  . Highest education level: Not on file  Occupational History  . Not on file  Social Needs  . Financial resource strain: Not on file  . Food insecurity:    Worry: Not on file    Inability: Not on file  . Transportation needs:    Medical: Not on file    Non-medical: Not on file  Tobacco Use  .  Smoking status: Current Every Day Smoker    Packs/day: 0.10  . Smokeless tobacco: Never Used  . Tobacco comment: 5 cigarettes a week  Substance and Sexual Activity  . Alcohol use: Yes    Alcohol/week: 1.0 standard drinks    Types: 1 Cans of beer per week    Comment: occasionally  . Drug use: Never  . Sexual activity: Not on file  Lifestyle  . Physical activity:    Days per week: Not on file    Minutes per session: Not on file  . Stress: Not on file  Relationships  . Social connections:    Talks on phone: Not on file    Gets together: Not on file    Attends religious service: Not on file    Active member of club or organization: Not on file    Attends meetings of clubs or organizations: Not on file    Relationship status: Not on file  . Intimate partner violence:    Fear of current or ex partner: Not on file    Emotionally abused: Not on file    Physically abused: Not on file    Forced sexual activity: Not on file  Other Topics Concern  . Not on  file  Social History Narrative  . Not on file   Family History  Problem Relation Age of Onset  . Cancer Father    Current Outpatient Medications on File Prior to Visit  Medication Sig  . hydrochlorothiazide (HYDRODIURIL) 25 MG tablet Take 25 mg by mouth daily.   No current facility-administered medications on file prior to visit.     Review of Systems  Constitutional: Negative for chills and fever.  HENT: Negative for congestion and sore throat.   Eyes: Negative for pain.  Respiratory: Negative for cough, shortness of breath and wheezing.   Cardiovascular: Positive for leg swelling. Negative for chest pain and palpitations.  Gastrointestinal: Negative for abdominal pain, constipation, diarrhea, nausea and vomiting.  Endocrine: Negative for polydipsia.  Genitourinary: Negative for dysuria, frequency, hematuria and urgency.  Musculoskeletal: Positive for arthralgias. Negative for back pain, myalgias and neck pain.  Skin:  Negative.  Negative for rash.  Allergic/Immunologic: Negative for environmental allergies.  Neurological: Positive for headaches. Negative for dizziness, weakness and numbness.  Hematological: Does not bruise/bleed easily.  Psychiatric/Behavioral: Negative for dysphoric mood and suicidal ideas. The patient is not nervous/anxious.    Per HPI unless specifically indicated above     Objective:    BP 138/76 (BP Location: Right Arm, Patient Position: Sitting, Cuff Size: Normal)   Pulse 69   Temp 98.1 F (36.7 C) (Oral)   Ht 5\' 3"  (1.6 m)   Wt 225 lb 9.6 oz (102.3 kg)   BMI 39.96 kg/m   Wt Readings from Last 3 Encounters:  05/02/18 225 lb 9.6 oz (102.3 kg)  03/10/18 221 lb (100.2 kg)  02/24/18 225 lb 3.2 oz (102.2 kg)    Physical Exam Vitals signs reviewed.  Constitutional:      General: She is not in acute distress.    Appearance: She is well-developed.  HENT:     Head: Normocephalic and atraumatic.   Cardiovascular:     Rate and Rhythm: Normal rate. Rhythm irregularly irregular.     Pulses:          Radial pulses are 2+ on the right side and 2+ on the left side.       Posterior tibial pulses are 1+ on the right side and 1+ on the left side.     Heart sounds: Normal heart sounds, S1 normal and S2 normal.  Pulmonary:     Effort: Pulmonary effort is normal. No respiratory distress.     Breath sounds: Normal breath sounds and air entry.  Musculoskeletal:     Right lower leg: No edema.     Left lower leg: No edema.  Skin:    General: Skin is warm and dry.     Capillary Refill: Capillary refill takes less than 2 seconds.  Neurological:     General: No focal deficit present.     Mental Status: She is alert and oriented to person, place, and time.     Cranial Nerves: No cranial nerve deficit.     Sensory: No sensory deficit.     Gait: Gait normal.     Deep Tendon Reflexes: Reflexes are normal and symmetric.     Comments: Photophobia L>R, phonophobia noted  Psychiatric:          Attention and Perception: Attention normal.        Mood and Affect: Mood and affect normal.        Behavior: Behavior normal. Behavior is cooperative.        Thought Content:  Thought content normal.        Judgment: Judgment normal.     05/05/18 EKG: NSR with PACs  HR 70bpm PR QRS 86 ms     QT   QTc Axis deviation: none   Results for orders placed or performed in visit on 02/24/18  CULTURE, URINE COMPREHENSIVE  Result Value Ref Range   Urine Culture, Comprehensive Final report    Organism ID, Bacteria Comment   Microscopic Examination  Result Value Ref Range   WBC, UA None seen 0 - 5 /hpf   RBC, UA None seen 0 - 2 /hpf   Epithelial Cells (non renal) 0-10 0 - 10 /hpf   Mucus, UA Present (A) Not Estab.   Bacteria, UA Few (A) None seen/Few  Urinalysis, Complete  Result Value Ref Range   Specific Gravity, UA 1.020 1.005 - 1.030   pH, UA 6.0 5.0 - 7.5   Color, UA Yellow Yellow   Appearance Ur Clear Clear   Leukocytes, UA Negative Negative   Protein, UA Negative Negative/Trace   Glucose, UA Negative Negative   Ketones, UA Negative Negative   RBC, UA Negative Negative   Bilirubin, UA Negative Negative   Urobilinogen, Ur 0.2 0.2 - 1.0 mg/dL   Nitrite, UA Negative Negative   Microscopic Examination See below:       Assessment & Plan:   Problem List Items Addressed This Visit      Cardiovascular and Mediastinum   Hypertension Uncontrolled hypertension.  BP goal < 130/80.  Pt is not currently working on lifestyle modifications.  Taking medications tolerating well without side effects. Complications: Lower leg swelling.  Plan: 1. INCREASE hydrochlorothiazide 25 mg once daily 2. Obtain labs next visit  3. Encouraged heart healthy diet and increasing exercise to 30 minutes most days of the week. 4. Check BP 1-2 x per week at home, keep log, and bring to clinic at next appointment. 5. Follow up 3 months.     Relevant Medications   hydrochlorothiazide  (HYDRODIURIL) 25 MG tablet    Other Visit Diagnoses    Encounter to establish care    -  Primary Previous PCP was at [place].  Records are reviewed in [facility].  Past medical, family, and surgical history reviewed w/ pt.     Persistent migraine aura without cerebral infarction and without status migrainosus, not intractable     Consistent with persistent migraine HA x 3 days w/ intermittent worsening, unilateral localized to left side with some radiation to R side of head - Inadequately treated for migraine HA at home - Skin lesion is likely sebaceous cyst.  This is not currently complicated and can be removed in future by dermatology.  Plan: 1. Start abortive therapy with Sumatriptan 25mg  tabs - take 1 PRN (#12, 0 refill due to quantity limit), severe HA, may repeat dose within 2 hr if persistent, no more in 24 hours, in future can titrate dose to 50-100mg  if needed. Counseling on potential side effect / intolerance with chest discomfort acutely after taking sumatriptan 2. Recommend taking Ibuprofen 600-800mg  q 8 hr PRN, and can try Tylenol 1000mg  TID alternatively 3. Avoid triggers including foods, caffeine. Important to rest. - Discussion on future migraine prophylaxis medications - handout given, review options at next visit, determine need if using sumatriptan frequently 4. Start headache diary, handout given, identify triggers for avoidance, bring to next visit 5. Return criteria given for acute migraine, when to go to office vs ED  Relevant Medications   hydrochlorothiazide (HYDRODIURIL) 25 MG tablet   SUMAtriptan (IMITREX) 25 MG tablet   Irregular heart beat     Irregular HR today on exam.  Concern for irregularly irregular HR being Afib vs PACs.   - EKG today - shows NSR with PACs. - Monitor for racing HR and persistently irregular HR. - FOLLOW-UP prn.   Relevant Orders   EKG 12-Lead      Meds ordered this encounter  Medications  . hydrochlorothiazide (HYDRODIURIL) 25 MG tablet     Sig: Take 1 tablet (25 mg total) by mouth daily.    Dispense:  90 tablet    Refill:  1    Order Specific Question:   Supervising Provider    Answer:   Smitty CordsKARAMALEGOS, ALEXANDER J [[id number]]  . SUMAtriptan (IMITREX) 25 MG tablet    Sig: Take 1 tablet (25 mg total) by mouth every 2 (two) hours as needed for migraine. May repeat in 2 hours if headache persists or recurs.    Dispense:  10 tablet    Refill:  0    Order Specific Question:   Supervising Provider    Answer:   Smitty CordsKARAMALEGOS, ALEXANDER J [[id number]]    Follow up plan: Return in about 3 months (around 08/01/2018) for annual physical AND as needed for repeat headaches.  Wilhelmina McardleLauren Jaycee Pelzer, DNP, AGPCNP-BC Adult Gerontology Primary Care Nurse Practitioner [facility] 05/02/2018, 10:38 AM

## 2018-05-04 ENCOUNTER — Encounter: Payer: Self-pay | Admitting: Urology

## 2018-05-04 ENCOUNTER — Other Ambulatory Visit: Payer: 59 | Admitting: Urology

## 2018-05-05 ENCOUNTER — Encounter: Payer: Self-pay | Admitting: Nurse Practitioner

## 2018-05-08 ENCOUNTER — Encounter: Payer: Self-pay | Admitting: Urology

## 2018-05-09 NOTE — Telephone Encounter (Signed)
Certified letter sent 05/09/2018 

## 2018-06-27 ENCOUNTER — Other Ambulatory Visit: Payer: Self-pay

## 2018-06-27 ENCOUNTER — Encounter: Payer: Self-pay | Admitting: Nurse Practitioner

## 2018-06-27 ENCOUNTER — Ambulatory Visit (INDEPENDENT_AMBULATORY_CARE_PROVIDER_SITE_OTHER): Payer: 59 | Admitting: Nurse Practitioner

## 2018-06-27 ENCOUNTER — Other Ambulatory Visit (HOSPITAL_COMMUNITY)
Admission: RE | Admit: 2018-06-27 | Discharge: 2018-06-27 | Disposition: A | Discharge status: Home / Self Care | Payer: 59 | Source: Ambulatory Visit | Attending: Nurse Practitioner | Admitting: Nurse Practitioner

## 2018-06-27 VITALS — BP 132/68 | HR 64 | Temp 98.0°F | Ht 63.0 in | Wt 222.4 lb

## 2018-06-27 DIAGNOSIS — Z124 Encounter for screening for malignant neoplasm of cervix: Secondary | ICD-10-CM | POA: Diagnosis not present

## 2018-06-27 DIAGNOSIS — Z0000 Encounter for general adult medical examination without abnormal findings: Secondary | ICD-10-CM

## 2018-06-27 DIAGNOSIS — Z1239 Encounter for other screening for malignant neoplasm of breast: Secondary | ICD-10-CM | POA: Diagnosis not present

## 2018-06-27 DIAGNOSIS — Z1211 Encounter for screening for malignant neoplasm of colon: Secondary | ICD-10-CM

## 2018-06-27 DIAGNOSIS — Z23 Encounter for immunization: Secondary | ICD-10-CM

## 2018-06-27 DIAGNOSIS — R5383 Other fatigue: Secondary | ICD-10-CM | POA: Diagnosis not present

## 2018-06-27 NOTE — Patient Instructions (Addendum)
Caroline Turner,   Thank you for coming in to clinic today.  1. Keep working toward your healthy lifestyle and weight loss.  2. [facility] will call to schedule your colonoscopy around your birthday.    3. Shingrix - Shingles vaccine: 2 doses 2-6 months apart.  [email or web address] for more information if you have questions. This is recommended starting at age 50.  Call your insurance to see if it is fully covered before receiving this from our office.  Please schedule a follow-up appointment with Wilhelmina Mcardle, AGNP. Return in about 1 year (around 06/27/2019) for annual physical.  If you have any other questions or concerns, please feel free to call the clinic or send a message through MyChart. You may also schedule an earlier appointment if necessary.  You will receive a survey after today's visit either digitally by e-mail or paper by [employer]. Your experiences and feedback matter to [place].  Please respond so we know how we are doing as we provide care for you.   Wilhelmina Mcardle, DNP, AGNP-BC Adult Gerontology Nurse Practitioner [facility]  Tdap Vaccine (Tetanus, Diphtheria and Pertussis): What You Need to Know 1. Why get vaccinated? Tetanus, diphtheria and pertussis are very serious diseases. Tdap vaccine can protect [place] from these diseases. And, Tdap vaccine given to pregnant women can protect newborn babies against pertussis.Marland Kitchen TETANUS (Lockjaw) is rare in the [place] today. It causes painful muscle tightening and stiffness, usually all over the body.  It can lead to tightening of muscles in the head and neck so you can't open your mouth, swallow, or sometimes even breathe. Tetanus kills about 1 out of 10 people who are infected even after receiving the best medical care. DIPHTHERIA is also rare in the [place] today. It can cause a thick coating to form in the back of the throat.  It can lead to breathing problems, heart failure, paralysis, and death. PERTUSSIS  (Whooping Cough) causes severe coughing spells, which can cause difficulty breathing, vomiting and disturbed sleep.  It can also lead to weight loss, incontinence, and rib fractures. Up to 2 in 100 adolescents and 5 in 100 adults with pertussis are hospitalized or have complications, which could include pneumonia or death. These diseases are caused by bacteria. Diphtheria and pertussis are spread from person to person through secretions from coughing or sneezing. Tetanus enters the body through cuts, scratches, or wounds. Before vaccines, as many as 200,000 cases of diphtheria, 200,000 cases of pertussis, and hundreds of cases of tetanus, were reported in the [place] each year. Since vaccination began, reports of cases for tetanus and diphtheria have dropped by about 99% and for pertussis by about 80%. 2. Tdap vaccine Tdap vaccine can protect adolescents and adults from tetanus, diphtheria, and pertussis. One dose of Tdap is routinely given at age 17 or 55. People who did not get Tdap at that age should get it as soon as possible. Tdap is especially important for healthcare professionals and anyone having close contact with a baby younger than 12 months. Pregnant women should get a dose of Tdap during every pregnancy, to protect the newborn from pertussis. Infants are most at risk for severe, life-threatening complications from pertussis. Another vaccine, called Td, protects against tetanus and diphtheria, but not pertussis. A Td booster should be given every 10 years. Tdap may be given as one of these boosters if you have never gotten Tdap before. Tdap may also be given after a severe cut or  burn to prevent tetanus infection. Your doctor or the person giving you the vaccine can give you more information. Tdap may safely be given at the same time as other vaccines. 3. Some people should not get this vaccine  A person who has ever had a life-threatening allergic reaction after a previous dose of  any diphtheria, tetanus or pertussis containing vaccine, OR has a severe allergy to any part of this vaccine, should not get Tdap vaccine. Tell the person giving the vaccine about any severe allergies.  Anyone who had coma or long repeated seizures within 7 days after a childhood dose of DTP or DTaP, or a previous dose of Tdap, should not get Tdap, unless a cause other than the vaccine was found. They can still get Td.  Talk to your doctor if you: ? have seizures or another nervous system problem, ? had severe pain or swelling after any vaccine containing diphtheria, tetanus or pertussis, ? ever had a condition called Guillain-Barr Syndrome (GBS), ? aren't feeling well on the day the shot is scheduled. 4. Risks With any medicine, including vaccines, there is a chance of side effects. These are usually mild and go away on their own. Serious reactions are also possible but are rare. Most people who get Tdap vaccine do not have any problems with it. Mild problems following Tdap (Did not interfere with activities)  Pain where the shot was given (about 3 in 4 adolescents or 2 in 3 adults)  Redness or swelling where the shot was given (about 1 person in 5)  Mild fever of at least 100.74F (up to about 1 in 25 adolescents or 1 in 100 adults)  Headache (about 3 or 4 people in 10)  Tiredness (about 1 person in 3 or 4)  Nausea, vomiting, diarrhea, stomach ache (up to 1 in 4 adolescents or 1 in 10 adults)  Chills, sore joints (about 1 person in 10)  Body aches (about 1 person in 3 or 4)  Rash, swollen glands (uncommon) Moderate problems following Tdap (Interfered with activities, but did not require medical attention)  Pain where the shot was given (up to 1 in 5 or 6)  Redness or swelling where the shot was given (up to about 1 in 16 adolescents or 1 in 12 adults)  Fever over 102F (about 1 in 100 adolescents or 1 in 250 adults)  Headache (about 1 in 7 adolescents or 1 in 10  adults)  Nausea, vomiting, diarrhea, stomach ache (up to 1 or 3 people in 100)  Swelling of the entire arm where the shot was given (up to about 1 in 500). Severe problems following Tdap (Unable to perform usual activities; required medical attention)  Swelling, severe pain, bleeding and redness in the arm where the shot was given (rare). Problems that could happen after any vaccine:  People sometimes faint after a medical procedure, including vaccination. Sitting or lying down for about 15 minutes can help prevent fainting, and injuries caused by a fall. Tell your doctor if you feel dizzy, or have vision changes or ringing in the ears.  Some people get severe pain in the shoulder and have difficulty moving the arm where a shot was given. This happens very rarely.  Any medication can cause a severe allergic reaction. Such reactions from a vaccine are very rare, estimated at fewer than 1 in a million doses, and would happen within a few minutes to a few hours after the vaccination. As with any medicine, there is  a very remote chance of a vaccine causing a serious injury or death. The safety of vaccines is always being monitored. For more information, visit: http://floyd.org/ 5. What if there is a serious problem? What should I look for?  Look for anything that concerns you, such as signs of a severe allergic reaction, very high fever, or unusual behavior. Signs of a severe allergic reaction can include hives, swelling of the face and throat, difficulty breathing, a fast heartbeat, dizziness, and weakness. These would usually start a few minutes to a few hours after the vaccination. What should I do?  If you think it is a severe allergic reaction or other emergency that can't wait, call 9-1-1 or get the person to the nearest hospital. Otherwise, call your doctor.  Afterward, the reaction should be reported to the Vaccine Adverse Event Reporting System (VAERS). Your doctor might file  this report, or you can do it yourself through the VAERS web site at www.vaers.LAgents.no, or by calling 1-401-395-0341. VAERS does not give medical advice. 6. The National Vaccine Injury Compensation Program The [employer] Vaccine Injury Compensation Program (VICP) is a federal program that was created to compensate people who may have been injured by certain vaccines. Persons who believe they may have been injured by a vaccine can learn about the program and about filing a claim by calling 1-(530)266-1829 or visiting the VICP website at SpiritualWord.at. There is a time limit to file a claim for compensation. 7. How can I learn more?  Ask your doctor. He or she can give you the vaccine package insert or suggest other sources of information.  Call your local or state health department.  Contact the Centers for Disease Control and Prevention (CDC): ? Call 760-329-2602 (1-800-CDC-INFO) or ? Visit CDC's website at PicCapture.uy Vaccine Information Statement Tdap Vaccine (06/19/2013) This information is not intended to replace advice given to you by your health care provider. Make sure you discuss any questions you have with your health care provider. Document Released: 10/12/2011 Document Revised: 11/28/2017 Document Reviewed: 11/28/2017 Elsevier Interactive Patient Education  2019 [employer].

## 2018-06-27 NOTE — Progress Notes (Signed)
Subjective:    Patient ID: Caroline Turner, female    DOB: 1971/03/17, 48 y.o.   MRN: [id number]  Caroline Turner is a 48 y.o. female presenting on 06/27/2018 for Annual Exam   HPI Annual Physical Exam Patient has been feeling well.  They have no acute concerns today. Sleeps 6 hours per night interrupted.  Has trouble sleeping with menopausal symptoms of hot flashes and urination. Feels rested in mornings.  HEALTH MAINTENANCE: Weight/BMI: stable to losing Physical activity: no regular Diet: low carb diet Seatbelt: always Sunscreen: not regularly, but for prolonged periods PAP: due today.  Patient s/p hysterectomy - positive for high risk HPV types in past. Mammogram: due - No abnormal in past, no family history of breast cancer  DEXA: next year Colon Cancer Screen: due at 89 - ordered for June HIV: Due Optometry: regular Dentistry: regular  VACCINES: Tetanus: Due Influenza: received this season Shingles:    Past Medical History:  Diagnosis Date  . History of swelling of feet   . Hyperlipidemia   . Hypertension   . Kidney stones    Past Surgical History:  Procedure Laterality Date  . CHOLECYSTECTOMY    . CHOLECYSTECTOMY    . NEPHROLITHOTOMY    . PARTIAL HYSTERECTOMY    . SHOULDER SURGERY Left    Social History   Socioeconomic History  . Marital status: Married    Spouse name: Not on file  . Number of children: 3  . Years of education: Not on file  . Highest education level: Master's degree (e.g., MA, MS, MEng, MEd, MSW, MBA)  Occupational History  . Not on file  Social Needs  . Financial resource strain: Not on file  . Food insecurity:    Worry: Not on file    Inability: Not on file  . Transportation needs:    Medical: Not on file    Non-medical: Not on file  Tobacco Use  . Smoking status: Current Every Day Smoker    Packs/day: 0.10  . Smokeless tobacco: Never Used  . Tobacco comment: 5 cigarettes a week  Substance and Sexual Activity  . Alcohol use: Yes      Alcohol/week: 1.0 standard drinks    Types: 1 Cans of beer per week    Comment: occasionally  . Drug use: Never  . Sexual activity: Yes  Lifestyle  . Physical activity:    Days per week: Not on file    Minutes per session: Not on file  . Stress: Not on file  Relationships  . Social connections:    Talks on phone: Not on file    Gets together: Not on file    Attends religious service: Not on file    Active member of club or organization: Not on file    Attends meetings of clubs or organizations: Not on file    Relationship status: Not on file  . Intimate partner violence:    Fear of current or ex partner: Not on file    Emotionally abused: Not on file    Physically abused: Not on file    Forced sexual activity: Not on file  Other Topics Concern  . Not on file  Social History Narrative  . Not on file   Family History  Problem Relation Age of Onset  . Cancer Father        lung, liver, mets to brain  . Dementia Father   . Vascular Disease Mother   . Dementia Mother  vascular dementia   Current Outpatient Medications on File Prior to Visit  Medication Sig  . hydrochlorothiazide (HYDRODIURIL) 25 MG tablet Take 1 tablet (25 mg total) by mouth daily.  . SUMAtriptan (IMITREX) 25 MG tablet Take 1 tablet (25 mg total) by mouth every 2 (two) hours as needed for migraine. May repeat in 2 hours if headache persists or recurs. (Patient not taking: Reported on 06/27/2018)   No current facility-administered medications on file prior to visit.     Review of Systems  Constitutional: Negative for activity change, appetite change and fatigue.  Respiratory: Negative for cough and shortness of breath.   Cardiovascular: Negative for chest pain, palpitations and leg swelling.  Gastrointestinal: Negative for constipation, diarrhea, nausea and vomiting.  Genitourinary: Negative for dysuria, frequency and urgency.  Musculoskeletal: Negative for arthralgias and myalgias.  Skin:  Negative for rash.  Neurological: Negative for dizziness and headaches.  Psychiatric/Behavioral: Negative for dysphoric mood. The patient is not nervous/anxious.    Per HPI unless specifically indicated above     Objective:    BP 132/68 (BP Location: Right Arm, Patient Position: Sitting, Cuff Size: Normal)   Pulse 64   Temp 98 F (36.7 C) (Oral)   Ht 5\' 3"  (1.6 m)   Wt 222 lb 6.4 oz (100.9 kg)   BMI 39.40 kg/m   Wt Readings from Last 3 Encounters:  06/27/18 222 lb 6.4 oz (100.9 kg)  05/02/18 225 lb 9.6 oz (102.3 kg)  03/10/18 221 lb (100.2 kg)    Physical Exam Vitals signs and nursing note reviewed.  Constitutional:      General: She is not in acute distress.    Appearance: She is well-developed.  HENT:     Head: Normocephalic and atraumatic.     Right Ear: External ear normal.     Left Ear: External ear normal.     Nose: Nose normal.  Eyes:     Conjunctiva/sclera: Conjunctivae normal.     Pupils: Pupils are equal, round, and reactive to light.  Neck:     Musculoskeletal: Normal range of motion and neck supple.     Thyroid: No thyromegaly.     Vascular: No JVD.     Trachea: No tracheal deviation.  Cardiovascular:     Rate and Rhythm: Normal rate and regular rhythm.     Heart sounds: Normal heart sounds. No murmur. No friction rub. No gallop.   Pulmonary:     Effort: Pulmonary effort is normal. No respiratory distress.     Breath sounds: Normal breath sounds.  Chest:     Comments: Breast - Normal exam w/ symmetric breasts, no mass, no nipple discharge, no skin changes or tenderness.  Hetergenously dense breasts. Abdominal:     General: Bowel sounds are normal. There is no distension.     Palpations: Abdomen is soft.     Tenderness: There is no abdominal tenderness.  Genitourinary:    Comments: Normal external female genitalia without lesions or fusion. Vaginal canal without lesions. Cervix surgically absent.   Physiologic discharge on exam. Bimanual exam without  adnexal masses, or cervical motion tenderness. Musculoskeletal: Normal range of motion.  Lymphadenopathy:     Cervical: No cervical adenopathy.  Skin:    General: Skin is warm and dry.  Neurological:     Mental Status: She is alert and oriented to person, place, and time.     Cranial Nerves: No cranial nerve deficit.  Psychiatric:        Behavior: Behavior normal.  Thought Content: Thought content normal.        Judgment: Judgment normal.    Results for orders placed or performed in visit on 02/24/18  CULTURE, URINE COMPREHENSIVE  Result Value Ref Range   Urine Culture, Comprehensive Final report    Organism ID, Bacteria Comment   Microscopic Examination  Result Value Ref Range   WBC, UA None seen 0 - 5 /hpf   RBC, UA None seen 0 - 2 /hpf   Epithelial Cells (non renal) 0-10 0 - 10 /hpf   Mucus, UA Present (A) Not Estab.   Bacteria, UA Few (A) None seen/Few  Urinalysis, Complete  Result Value Ref Range   Specific Gravity, UA 1.020 1.005 - 1.030   pH, UA 6.0 5.0 - 7.5   Color, UA Yellow Yellow   Appearance Ur Clear Clear   Leukocytes, UA Negative Negative   Protein, UA Negative Negative/Trace   Glucose, UA Negative Negative   Ketones, UA Negative Negative   RBC, UA Negative Negative   Bilirubin, UA Negative Negative   Urobilinogen, Ur 0.2 0.2 - 1.0 mg/dL   Nitrite, UA Negative Negative   Microscopic Examination See below:       Assessment & Plan:   Problem List Items Addressed This Visit    None    Visit Diagnoses    Encounter for annual physical exam    -  Primary   Relevant Orders   Hemoglobin A1c   Lipid panel   TSH   COMPLETE METABOLIC PANEL WITH GFR   CBC with Differential/Platelet   Cytology - PAP   B12   VITAMIN D 25 Hydroxy (Vit-D Deficiency, Fractures)   Colon cancer screening       Relevant Orders   Ambulatory referral to Gastroenterology   Breast cancer screening       Relevant Orders   MM DIGITAL SCREENING BILATERAL   Cervical cancer  screening       Relevant Orders   Cytology - PAP   Fatigue, unspecified type       Relevant Orders   B12   VITAMIN D 25 Hydroxy (Vit-D Deficiency, Fractures)   Need for diphtheria-tetanus-pertussis (Tdap) vaccine       Relevant Orders   Tdap vaccine greater than or equal to 7yo IM (Completed)      Annual physical exam without new findings.  Well adult with no acute concerns.  Plan: 1. Obtain health maintenance screenings as above according to age. - Increase physical activity to 30 minutes most days of the week.  - Eat healthy diet high in vegetables and fruits; low in refined carbohydrates. - Screening labs and tests as ordered - Immunizations as above 2. Return 1 year for annual physical.   Follow up plan: Return in about 1 year (around 06/27/2019) for annual physical.  Wilhelmina Mcardle, DNP, AGPCNP-BC Adult Gerontology Primary Care Nurse Practitioner [facility] 06/27/2018, 9:22 AM

## 2018-06-28 LAB — CBC WITH DIFFERENTIAL/PLATELET
Absolute Monocytes: 355 cells/uL (ref 200–950)
Basophils Absolute: 20 cells/uL (ref 0–200)
Basophils Relative: 0.4 %
Eosinophils Absolute: 80 cells/uL (ref 15–500)
Eosinophils Relative: 1.6 %
HCT: 37.6 % (ref 35.0–45.0)
Hemoglobin: 12.6 g/dL (ref 11.7–15.5)
Lymphs Abs: 1705 cells/uL (ref 850–3900)
MCH: 29.3 pg (ref 27.0–33.0)
MCHC: 33.5 g/dL (ref 32.0–36.0)
MCV: 87.4 fL (ref 80.0–100.0)
MPV: 12.3 fL (ref 7.5–12.5)
Monocytes Relative: 7.1 %
Neutro Abs: 2840 cells/uL (ref 1500–7800)
Neutrophils Relative %: 56.8 %
Platelets: 259 10*3/uL (ref 140–400)
RBC: 4.3 10*6/uL (ref 3.80–5.10)
RDW: 12.2 % (ref 11.0–15.0)
Total Lymphocyte: 34.1 %
WBC: 5 10*3/uL (ref 3.8–10.8)

## 2018-06-28 LAB — COMPLETE METABOLIC PANEL WITH GFR
AG Ratio: 1.5 (calc) (ref 1.0–2.5)
ALT: 17 U/L (ref 6–29)
AST: 11 U/L (ref 10–35)
Albumin: 4.1 g/dL (ref 3.6–5.1)
Alkaline phosphatase (APISO): 57 U/L (ref 31–125)
BUN: 11 mg/dL (ref 7–25)
CO2: 31 mmol/L (ref 20–32)
Calcium: 9.3 mg/dL (ref 8.6–10.2)
Chloride: 103 mmol/L (ref 98–110)
Creat: 0.56 mg/dL (ref 0.50–1.10)
GFR, Est African American: 127 mL/min/{1.73_m2} (ref >=60)
GFR, Est Non African American: 109 mL/min/{1.73_m2} (ref >=60)
Globulin: 2.7 g/dL (calc) (ref 1.9–3.7)
Glucose, Bld: 92 mg/dL (ref 65–99)
Potassium: 4.3 mmol/L (ref 3.5–5.3)
Sodium: 140 mmol/L (ref 135–146)
Total Bilirubin: 0.4 mg/dL (ref 0.2–1.2)
Total Protein: 6.8 g/dL (ref 6.1–8.1)

## 2018-06-28 LAB — LIPID PANEL
Cholesterol: 176 mg/dL (ref <200)
HDL: 60 mg/dL (ref >=50)
LDL Cholesterol (Calc): 86 mg/dL (calc)
Non-HDL Cholesterol (Calc): 116 mg/dL (calc) (ref <130)
Total CHOL/HDL Ratio: 2.9 (calc) (ref <5.0)
Triglycerides: 198 mg/dL — ABNORMAL HIGH (ref <150)

## 2018-06-28 LAB — HEMOGLOBIN A1C
Hgb A1c MFr Bld: 5.4 % of total Hgb (ref <5.7)
Mean Plasma Glucose: 108 (calc)
eAG (mmol/L): 6 (calc)

## 2018-06-28 LAB — TSH: TSH: 1.05 mIU/L

## 2018-06-28 LAB — VITAMIN B12: Vitamin B-12: 391 pg/mL (ref 200–1100)

## 2018-06-28 LAB — VITAMIN D 25 HYDROXY (VIT D DEFICIENCY, FRACTURES): Vit D, 25-Hydroxy: 18 ng/mL — ABNORMAL LOW (ref 30–100)

## 2018-06-29 LAB — CYTOLOGY - PAP
Diagnosis: NEGATIVE
HPV: NOT DETECTED

## 2018-07-18 MED FILL — HYDROCHLOROTHIAZIDE 25 MG T: 25 | 90 days supply | Qty: 90 | Fill #0

## 2018-07-24 ENCOUNTER — Encounter: Payer: Self-pay | Admitting: *Deleted

## 2018-09-19 DIAGNOSIS — M65331 Trigger finger, right middle finger: Secondary | ICD-10-CM | POA: Diagnosis not present

## 2018-09-19 DIAGNOSIS — M65341 Trigger finger, right ring finger: Secondary | ICD-10-CM | POA: Diagnosis not present

## 2019-01-10 DIAGNOSIS — M65331 Trigger finger, right middle finger: Secondary | ICD-10-CM | POA: Diagnosis not present

## 2019-03-14 ENCOUNTER — Ambulatory Visit: Payer: 59 | Admitting: Family Medicine

## 2019-04-13 ENCOUNTER — Other Ambulatory Visit: Payer: Self-pay | Admitting: Nurse Practitioner

## 2019-04-13 DIAGNOSIS — I10 Essential (primary) hypertension: Secondary | ICD-10-CM

## 2019-07-03 ENCOUNTER — Encounter: Payer: Self-pay | Admitting: Family Medicine

## 2019-07-03 ENCOUNTER — Other Ambulatory Visit: Payer: Self-pay | Admitting: Family Medicine

## 2019-07-03 ENCOUNTER — Other Ambulatory Visit: Payer: Self-pay

## 2019-07-03 ENCOUNTER — Ambulatory Visit (INDEPENDENT_AMBULATORY_CARE_PROVIDER_SITE_OTHER): Payer: 59 | Admitting: Family Medicine

## 2019-07-03 VITALS — BP 129/78 | HR 67 | Temp 97.3°F | Ht 63.0 in | Wt 224.0 lb

## 2019-07-03 DIAGNOSIS — Z0000 Encounter for general adult medical examination without abnormal findings: Secondary | ICD-10-CM | POA: Insufficient documentation

## 2019-07-03 DIAGNOSIS — E559 Vitamin D deficiency, unspecified: Secondary | ICD-10-CM

## 2019-07-03 DIAGNOSIS — K5900 Constipation, unspecified: Secondary | ICD-10-CM

## 2019-07-03 DIAGNOSIS — Z1231 Encounter for screening mammogram for malignant neoplasm of breast: Secondary | ICD-10-CM

## 2019-07-03 DIAGNOSIS — I10 Essential (primary) hypertension: Secondary | ICD-10-CM

## 2019-07-03 DIAGNOSIS — Z114 Encounter for screening for human immunodeficiency virus [HIV]: Secondary | ICD-10-CM

## 2019-07-03 DIAGNOSIS — M549 Dorsalgia, unspecified: Secondary | ICD-10-CM

## 2019-07-03 DIAGNOSIS — F439 Reaction to severe stress, unspecified: Secondary | ICD-10-CM

## 2019-07-03 DIAGNOSIS — E781 Pure hyperglyceridemia: Secondary | ICD-10-CM

## 2019-07-03 DIAGNOSIS — G8929 Other chronic pain: Secondary | ICD-10-CM

## 2019-07-03 DIAGNOSIS — L659 Nonscarring hair loss, unspecified: Secondary | ICD-10-CM

## 2019-07-03 DIAGNOSIS — G43509 Persistent migraine aura without cerebral infarction, not intractable, without status migrainosus: Secondary | ICD-10-CM

## 2019-07-03 DIAGNOSIS — Z1211 Encounter for screening for malignant neoplasm of colon: Secondary | ICD-10-CM

## 2019-07-03 MED ORDER — HYDROCHLOROTHIAZIDE 25 MG PO TABS: 25.0000 mg | ORAL_TABLET | Freq: Every day | ORAL | 0 refills | Status: DC

## 2019-07-03 MED ORDER — POLYETHYLENE GLYCOL 3350 17 GM/SCOOP PO POWD: 17.0000 g | Freq: Two times a day (BID) | ORAL | 1 refills | Status: DC | PRN

## 2019-07-03 NOTE — Assessment & Plan Note (Signed)
Unknown control, labs ordered for today.  No personal history of prior ASCVD events  Plan: 1) Labs ordered today 2) Heart healthy diet and to exercise every other day for 30 minutes per day, going no more than 2 days in a row without exercise. 3) We will see you back in 6 months

## 2019-07-03 NOTE — Patient Instructions (Addendum)
Have your labs drawn today and we will contact you with the results  I will reach out to our social worker to help with power of attorney concerns with your Mom.  If your labs come back normal for heavy metals and thyroid, I will send in the topical steroid cream for hair loss.  I have sent in a prescription for Mirilax to take 1-2 cap fulls daily as needed for intermittent constipation  For Mammogram screening for breast cancer   I have put in a referral for breast clinic to discuss possible breast reduction, at your request  Call the [facility] below anytime to schedule your own appointment now that order has been placed.  [facility] [place], [place] [address] Phone: [phone]  [facility] [address] [place], [place] [address] Phone: [phone]   Try to get exercise a minimum of 30 minutes per day at least 5 days per week as well as  adequate water intake all while measuring blood pressure a few times per week.  Keep a blood pressure log and bring back to clinic at your next visit.  If your readings are consistently over 140/90 to contact our office/send me a MyChart message and we will see you sooner.  Can try DASH and Mediterranean diet options, avoiding processed foods, lowering sodium intake, avoiding pork products, and eating a plant based diet for optimal health.  Having high HDL (good) cholesterol and low triglyceride levels can reduce your risk of heart attack and stroke.  The following steps can be taken to reduce triglyceride levels and increase HDL (good) cholesterol levels.  Avoid or limit alcohol Alcohol significantly raises triglyceride levels.  If your triglycerides are higher than 150, avoid or limit alcohol.  Limit fruit juice and dried fruits Even fresh fruit juice contains a large amount of fructose sugar and can increase triglyceride and blood sugar levels.  Fruit  juice and dried fruit are also high in calories and can add unwanted pounds.  Replace fruit juice and dried fruits with fresh fruit that has more fiber and fewer calories.  Limit non-diet soft drinks and sports drinks Non-diet soft drinks and sport drinks contain as many as 12 teaspoons of sugar per serving.  This sugar can increase triglyceride and blood sugar levels.  Non-diet soft drinks and sport drinks also contain calories that add unwanted pounds.  Replace non-diet soft drinks and sports drinks with water, unsweetened tea, diet colas or beverages sweetened with Nutra-Sweet.  Limit concentrated sweets Even fat free or low fat sweets can raise your triglyceride and/or blood sugar levels.  Concentrated sweets include sugar, honey, jelly, candy, cookies, cakes, pies, pastries, frozen desserts, puddings, and sugar sweetened cereals.  Replace concentrated sweets with high fiber foods such as fruit, low fat yogurt, sugar free gelatin and low fat puddings.  Limit refined carbohydrates Refined carbohydrates include white flour, white bread, white rice, and some pasta.  Limit portion sizes of these foods or replace them with whole wheat bread, lentils, whole grains, brown rice, and spinach or whole-wheat pastas.  Reduce your weight, if needed Even a 5-10 pound weight loss can cause your triglyceride level to decrease significantly.  You can lose 1-2 pounds per week by limiting your portion sizes and exercising 5-6 times per week.  Include monounsaturated fats in your diet.  Include Monounsaturated fats in your diet Moderate amounts of monounsaturated fat can raise your HDL (good) cholesterol.  Sources  of monounsaturated fat include olive oil, canola oil, peanut oil, peanuts, peanut butter, cashews, olives, and avocados.  Choose peanut butter that does not have sugar in the ingredient list.  Since these foods are high in calories, serving sizes should be moderate.  Include fish in your diet Fish is low  in saturated fat and rich in Omega-3 fatty acids.  Good choices include mackerel, salmon, herring, bluefish, lake trout, tuna, and sardines canned in oil.  If you smoke, quit Smoking lowers HDL (good) cholesterol and raises triglycerides.  When you quit smoking your HDL (good) cholesterol increases and triglycerides decrease.  Stay active Exercise raises your HDL (good) cholesterol.  Walk, ride a bike, or swim for at least 20 minutes, 3-5 times per week.  Ideally, you should try to exercise for 30-60 minutes most days of the week.  Check with your Healthcare Provider before beginning an exercise program.  We will plan to see you back in 6 months for hypertension follow up  You will receive a survey after today's visit either digitally by e-mail or paper by USPS mail. Your experiences and feedback matter to [place].  Please respond so we know how we are doing as we provide care for you.  Call us with any questions/concerns/needs.  It is my goal to be available to you for your health concerns.  Thanks for choosing me to be a partner in your healthcare needs!  Charlaine Dalton, FNP-C Family Nurse Practitioner [facility] Phone: [phone]

## 2019-07-03 NOTE — Progress Notes (Signed)
Subjective:    Patient ID: Caroline Turner, female    DOB: 10-26-1970, 49 y.o.   MRN: [id number]  Caroline Turner is a 49 y.o. female presenting on 07/03/2019 for Annual Exam (pt want to discuss possible getting a breast deduction)   HPI  Annual Physical Exam Patient has been feeling well.   HEALTH MAINTENANCE: Weight/BMI: Gained 2 lbs in the last year Physical activity: Has increased physical activity Diet: Heart healthy Seatbelt: At all times Mammogram: Due, order placed today Colon Cancer Screen: Due, order for colonoscopy placed today HIV screening:  Discussed and accepted, order placed today Optometry: Regularly Dentistry: Regularly  VACCINES: Tetanus: Completed 06/27/2018 Influenza: Did not receive this year COVID: Completed [place], Last dose 05/08/2019  Hypertension - She is not checking BP at home or outside of clinic.  - Current medications: hydrochlorothiazide 25mg  daily, tolerating well without side effects - She is not currently symptomatic. - Pt denies headaches, dizziness, lightheadedness, chest pain, palpitations, numbness, tingling, weakness - She  reports an exercise routine that includes cardio, multiple days per week. - Her diet is moderate in salt, moderate in fat, and moderate in carbohydrates.  Depression screen [facility] 2/9 07/03/2019 06/27/2018  Decreased Interest 0 0  Down, Depressed, Hopeless 0 0  PHQ - 2 Score 0 0  Altered sleeping - 1  Tired, decreased energy - 0  Change in appetite - 0  Feeling bad or failure about yourself  - 0  Trouble concentrating - 0  Moving slowly or fidgety/restless - 0  Suicidal thoughts - 0  PHQ-9 Score - 1  Difficult doing work/chores - Not difficult at all    Social History   Tobacco Use  . Smoking status: Current Every Day Smoker    Packs/day: 0.10  . Smokeless tobacco: Never Used  . Tobacco comment: 5 cigarettes a week  Substance Use Topics  . Alcohol use: Yes    Alcohol/week: 1.0 standard drinks    Types: 1 Cans of  beer per week    Comment: occasionally  . Drug use: Never    Review of Systems  Constitutional: Negative.   HENT: Negative.   Eyes: Negative.   Respiratory: Negative.   Cardiovascular: Negative.   Gastrointestinal: Negative.   Endocrine: Negative.   Genitourinary: Negative.   Musculoskeletal: Negative.   Skin: Positive for rash. Negative for color change, pallor and wound.  Allergic/Immunologic: Negative.   Neurological: Negative.   Hematological: Negative.   Psychiatric/Behavioral: Negative.    Per HPI unless specifically indicated above     Objective:    BP 129/78 (BP Location: Right Arm, Patient Position: Sitting, Cuff Size: Normal)   Pulse 67   Temp (!) 97.3 F (36.3 C) (Temporal)   Ht 5\' 3"  (1.6 m)   Wt 224 lb (101.6 kg)   BMI 39.68 kg/m   Wt Readings from Last 3 Encounters:  07/03/19 224 lb (101.6 kg)  06/27/18 222 lb 6.4 oz (100.9 kg)  05/02/18 225 lb 9.6 oz (102.3 kg)    Physical Exam Vitals reviewed.  Constitutional:      General: She is not in acute distress.    Appearance: Normal appearance. She is well-groomed. She is obese. She is not ill-appearing or toxic-appearing.  HENT:     Head: Normocephalic.     Right Ear: Tympanic membrane, ear canal and external ear normal. There is no impacted cerumen.     Left Ear: Tympanic membrane, ear canal and external ear normal. There is no impacted cerumen.  Nose: Nose normal. No congestion or rhinorrhea.     Mouth/Throat:     Lips: Pink.     Mouth: Mucous membranes are moist.     Pharynx: Oropharynx is clear. Uvula midline. No oropharyngeal exudate or posterior oropharyngeal erythema.  Eyes:     General: Lids are normal. Vision grossly intact. No scleral icterus.       Right eye: No discharge.        Left eye: No discharge.     Extraocular Movements: Extraocular movements intact.     Conjunctiva/sclera: Conjunctivae normal.     Pupils: Pupils are equal, round, and reactive to light.  Neck:     Thyroid:  No thyroid mass or thyromegaly.  Cardiovascular:     Rate and Rhythm: Normal rate and regular rhythm.     Pulses: Normal pulses.          Dorsalis pedis pulses are 2+ on the right side and 2+ on the left side.       Posterior tibial pulses are 2+ on the right side and 2+ on the left side.     Heart sounds: Normal heart sounds. No murmur. No friction rub. No gallop.   Pulmonary:     Effort: Pulmonary effort is normal. No respiratory distress.     Breath sounds: Normal breath sounds.  Abdominal:     General: Abdomen is flat. Bowel sounds are normal. There is no distension.     Palpations: Abdomen is soft. There is no hepatomegaly, splenomegaly or mass.     Tenderness: There is no abdominal tenderness. There is no guarding or rebound.     Hernia: No hernia is present.  Musculoskeletal:        General: Normal range of motion.     Cervical back: Normal range of motion and neck supple. No tenderness.     Right lower leg: No edema.     Left lower leg: No edema.  Feet:     Right foot:     Skin integrity: Skin integrity normal.     Left foot:     Skin integrity: Skin integrity normal.  Lymphadenopathy:     Cervical: No cervical adenopathy.  Skin:    General: Skin is warm and dry.     Capillary Refill: Capillary refill takes less than 2 seconds.     Comments: Two spots of hair loss in scalp, approx size of half dollar with some small hair regrowth  Neurological:     General: No focal deficit present.     Mental Status: She is alert and oriented to person, place, and time.     Cranial Nerves: No cranial nerve deficit.     Sensory: No sensory deficit.     Motor: No weakness.     Coordination: Coordination normal.     Gait: Gait normal.     Deep Tendon Reflexes: Reflexes normal.  Psychiatric:        Attention and Perception: Attention and perception normal.        Mood and Affect: Mood and affect normal.        Speech: Speech normal.        Behavior: Behavior normal. Behavior is  cooperative.        Thought Content: Thought content normal.        Cognition and Memory: Cognition and memory normal.        Judgment: Judgment normal.     Results for orders placed or performed in visit on  06/27/18  Hemoglobin A1c  Result Value Ref Range   Hgb A1c MFr Bld 5.4 <5.7 % of total Hgb   Mean Plasma Glucose 108 (calc)   eAG (mmol/L) 6.0 (calc)  Lipid panel  Result Value Ref Range   Cholesterol 176 <200 mg/dL   HDL 60 > OR = 50 mg/dL   Triglycerides 725 (H) <150 mg/dL   LDL Cholesterol (Calc) 86 mg/dL (calc)   Total CHOL/HDL Ratio 2.9 <5.0 (calc)   Non-HDL Cholesterol (Calc) 116 <130 mg/dL (calc)  TSH  Result Value Ref Range   TSH 1.05 mIU/L  COMPLETE METABOLIC PANEL WITH GFR  Result Value Ref Range   Glucose, Bld 92 65 - 99 mg/dL   BUN 11 7 - 25 mg/dL   Creat 3.66 4.40 - 3.47 mg/dL   GFR, Est Non African American 109 > OR = 60 mL/min/1.58m2   GFR, Est African American 127 > OR = 60 mL/min/1.67m2   BUN/Creatinine Ratio NOT APPLICABLE 6 - 22 (calc)   Sodium 140 135 - 146 mmol/L   Potassium 4.3 3.5 - 5.3 mmol/L   Chloride 103 98 - 110 mmol/L   CO2 31 20 - 32 mmol/L   Calcium 9.3 8.6 - 10.2 mg/dL   Total Protein 6.8 6.1 - 8.1 g/dL   Albumin 4.1 3.6 - 5.1 g/dL   Globulin 2.7 1.9 - 3.7 g/dL (calc)   AG Ratio 1.5 1.0 - 2.5 (calc)   Total Bilirubin 0.4 0.2 - 1.2 mg/dL   Alkaline phosphatase (APISO) 57 31 - 125 U/L   AST 11 10 - 35 U/L   ALT 17 6 - 29 U/L  CBC with Differential/Platelet  Result Value Ref Range   WBC 5.0 3.8 - 10.8 Thousand/uL   RBC 4.30 3.80 - 5.10 Million/uL   Hemoglobin 12.6 11.7 - 15.5 g/dL   HCT 42.5 95.6 - 38.7 %   MCV 87.4 80.0 - 100.0 fL   MCH 29.3 27.0 - 33.0 pg   MCHC 33.5 32.0 - 36.0 g/dL   RDW 56.4 33.2 - 95.1 %   Platelets 259 140 - 400 Thousand/uL   MPV 12.3 7.5 - 12.5 fL   Neutro Abs 2,840 1,500 - 7,800 cells/uL   Lymphs Abs 1,705 850 - 3,900 cells/uL   Absolute Monocytes 355 200 - 950 cells/uL   Eosinophils Absolute 80 15  - 500 cells/uL   Basophils Absolute 20 0 - 200 cells/uL   Neutrophils Relative % 56.8 %   Total Lymphocyte 34.1 %   Monocytes Relative 7.1 %   Eosinophils Relative 1.6 %   Basophils Relative 0.4 %  VITAMIN D 25 Hydroxy (Vit-D Deficiency, Fractures)  Result Value Ref Range   Vit D, 25-Hydroxy 18 (L) 30 - 100 ng/mL  Vitamin B12  Result Value Ref Range   Vitamin B-12 391 200 - 1,100 pg/mL  Cytology - PAP  Result Value Ref Range   Adequacy Satisfactory for evaluation.    Diagnosis      NEGATIVE FOR INTRAEPITHELIAL LESIONS OR MALIGNANCY.   HPV NOT DETECTED    Material Submitted Vaginal Pap [ThinPrep Imaged]    CYTOLOGY - PAP PAP RESULT       Assessment & Plan:   Problem List Items Addressed This Visit      Cardiovascular and Mediastinum   Hypertension    Controlled hypertension.  BP goal < 130/80.  Pt working on lifestyle modifications.  Taking medications tolerating well without side effects. No complications.  Plan: 1. Continue taking  hydrochlorothiazide 25mg  daily 2. Obtain labs today  3. Encouraged heart healthy diet and increasing exercise to 30 minutes most days of the week, going no more than 2 days in a row without exercise. 4. Check BP 1-2 x per week at home, keep log, and bring to clinic at next appointment. 5. Follow up 6 months.        Relevant Medications   hydrochlorothiazide (HYDRODIURIL) 25 MG tablet     Other   Hypertriglyceridemia    Unknown control, labs ordered for today.  No personal history of prior ASCVD events  Plan: 1) Labs ordered today 2) Heart healthy diet and to exercise every other day for 30 minutes per day, going no more than 2 days in a row without exercise. 3) We will see you back in 6 months       Relevant Medications   hydrochlorothiazide (HYDRODIURIL) 25 MG tablet   Other Relevant Orders   Lipid Profile   Morbid obesity (HCC)   Vitamin D deficiency - Primary    Labs ordered today for assessment and evaluation of Vitamin D  levels.      Relevant Orders   Vitamin D 1,25 dihydroxy   Routine medical exam   Relevant Orders   CBC with Differential   COMPLETE METABOLIC PANEL WITH GFR    Other Visit Diagnoses    Persistent migraine aura without cerebral infarction and without status migrainosus, not intractable       Relevant Medications   hydrochlorothiazide (HYDRODIURIL) 25 MG tablet   Screening for HIV (human immunodeficiency virus)       Relevant Orders   HIV antibody (with reflex)   Hair loss       Relevant Orders   Thyroid Panel With TSH   Heavy Metals Panel, Blood   Encounter for screening mammogram for malignant neoplasm of breast       Relevant Orders   MM Digital Screening   Chronic bilateral back pain, unspecified back location       Relevant Orders   Ambulatory referral to Breast Clinic   Constipation, unspecified constipation type       Relevant Medications   polyethylene glycol powder (GLYCOLAX/MIRALAX) 17 GM/SCOOP powder   Screening for malignant neoplasm of colon       Relevant Orders   Ambulatory referral to Gastroenterology      Meds ordered this encounter  Medications  . hydrochlorothiazide (HYDRODIURIL) 25 MG tablet    Sig: Take 1 tablet (25 mg total) by mouth daily.    Dispense:  90 tablet    Refill:  0  . polyethylene glycol powder (GLYCOLAX/MIRALAX) 17 GM/SCOOP powder    Sig: Take 17 g by mouth 2 (two) times daily as needed.    Dispense:  3350 g    Refill:  1      Follow up plan: Return in about 6 months (around 01/03/2020) for HTN, GERD, Lipids f/u.   03/04/2020, FNP Family Nurse Practitioner [facility] 07/03/2019, 9:26 AM

## 2019-07-03 NOTE — Assessment & Plan Note (Signed)
Controlled hypertension.  BP goal < 130/80.  Pt working on lifestyle modifications.  Taking medications tolerating well without side effects. No complications.  Plan: 1. Continue taking hydrochlorothiazide 25mg  daily 2. Obtain labs today  3. Encouraged heart healthy diet and increasing exercise to 30 minutes most days of the week, going no more than 2 days in a row without exercise. 4. Check BP 1-2 x per week at home, keep log, and bring to clinic at next appointment. 5. Follow up 6 months.

## 2019-07-03 NOTE — Assessment & Plan Note (Signed)
Labs ordered today for assessment and evaluation of Vitamin D levels.

## 2019-07-03 NOTE — Progress Notes (Signed)
Patient requesting assistance with information gathering regarding POA with social work for her Mother who has dementia and lives in [place].  Has been having some conflict with siblings regarding care for Mother.

## 2019-07-06 ENCOUNTER — Other Ambulatory Visit: Payer: Self-pay | Admitting: Family Medicine

## 2019-07-06 DIAGNOSIS — L659 Nonscarring hair loss, unspecified: Secondary | ICD-10-CM

## 2019-07-06 LAB — COMPLETE METABOLIC PANEL WITH GFR
AG Ratio: 1.6 (calc) (ref 1.0–2.5)
ALT: 20 U/L (ref 6–29)
AST: 11 U/L (ref 10–35)
Albumin: 4.2 g/dL (ref 3.6–5.1)
Alkaline phosphatase (APISO): 54 U/L (ref 37–153)
BUN: 12 mg/dL (ref 7–25)
CO2: 28 mmol/L (ref 20–32)
Calcium: 9.3 mg/dL (ref 8.6–10.4)
Chloride: 105 mmol/L (ref 98–110)
Creat: 0.56 mg/dL (ref 0.50–1.05)
GFR, Est African American: 126 mL/min/{1.73_m2} (ref >=60)
GFR, Est Non African American: 109 mL/min/{1.73_m2} (ref >=60)
Globulin: 2.7 g/dL (calc) (ref 1.9–3.7)
Glucose, Bld: 93 mg/dL (ref 65–99)
Potassium: 4 mmol/L (ref 3.5–5.3)
Sodium: 141 mmol/L (ref 135–146)
Total Bilirubin: 0.5 mg/dL (ref 0.2–1.2)
Total Protein: 6.9 g/dL (ref 6.1–8.1)

## 2019-07-06 LAB — CBC WITH DIFFERENTIAL/PLATELET
Absolute Monocytes: 382 cells/uL (ref 200–950)
Basophils Absolute: 20 cells/uL (ref 0–200)
Basophils Relative: 0.4 %
Eosinophils Absolute: 59 cells/uL (ref 15–500)
Eosinophils Relative: 1.2 %
HCT: 40.2 % (ref 35.0–45.0)
Hemoglobin: 13.1 g/dL (ref 11.7–15.5)
Lymphs Abs: 1749 cells/uL (ref 850–3900)
MCH: 29.1 pg (ref 27.0–33.0)
MCHC: 32.6 g/dL (ref 32.0–36.0)
MCV: 89.3 fL (ref 80.0–100.0)
MPV: 12 fL (ref 7.5–12.5)
Monocytes Relative: 7.8 %
Neutro Abs: 2690 cells/uL (ref 1500–7800)
Neutrophils Relative %: 54.9 %
Platelets: 260 10*3/uL (ref 140–400)
RBC: 4.5 10*6/uL (ref 3.80–5.10)
RDW: 12.2 % (ref 11.0–15.0)
Total Lymphocyte: 35.7 %
WBC: 4.9 10*3/uL (ref 3.8–10.8)

## 2019-07-06 LAB — LIPID PANEL
Cholesterol: 183 mg/dL (ref <200)
HDL: 68 mg/dL (ref >=50)
LDL Cholesterol (Calc): 92 mg/dL (calc)
Non-HDL Cholesterol (Calc): 115 mg/dL (calc) (ref <130)
Total CHOL/HDL Ratio: 2.7 (calc) (ref <5.0)
Triglycerides: 126 mg/dL (ref <150)

## 2019-07-06 LAB — THYROID PANEL WITH TSH
Free Thyroxine Index: 2.8 (ref 1.4–3.8)
T3 Uptake: 31 % (ref 22–35)
T4, Total: 9 ug/dL (ref 5.1–11.9)
TSH: 1.27 mIU/L

## 2019-07-06 LAB — HEAVY METALS PANEL, BLOOD
Arsenic: <10 mcg/L (ref <23)
Lead: <1 ug/dL (ref <5)
Mercury, B: 6 mcg/L (ref 0–10)

## 2019-07-06 LAB — VITAMIN D 1,25 DIHYDROXY
Vitamin D 1, 25 (OH)2 Total: 64 pg/mL (ref 18–72)
Vitamin D2 1, 25 (OH)2: <8 pg/mL
Vitamin D3 1, 25 (OH)2: 64 pg/mL

## 2019-07-06 LAB — HIV ANTIBODY (ROUTINE TESTING W REFLEX): HIV 1&2 Ab, 4th Generation: NONREACTIVE

## 2019-07-06 MED ORDER — DESOXIMETASONE 0.25 % EX CREA: 1.0000 "application " | TOPICAL_CREAM | Freq: Two times a day (BID) | CUTANEOUS | 0 refills | Status: DC

## 2019-07-06 NOTE — Progress Notes (Signed)
Labs reviewed, WNL.  Treating as Alopecia Areata for two spots with hair loss.  Sending in Rx for Desoximetasone cream 0.25% applied twice daily x 4 weeks and then will reassess.  If no change in areas of scalp hair loss, will send to Dermatology.

## 2019-07-06 NOTE — Progress Notes (Unsigned)
Labs reviewed, WNL.  Treating as Alopecia Areata for two spots with hair loss.  Sending in Rx for Desoximetasone cream 0.25% applied twice daily x 4 weeks and then will reassess.  If no change in areas of scalp hair loss, will send to Dermatology. 

## 2019-07-10 ENCOUNTER — Ambulatory Visit: Payer: Self-pay | Admitting: Licensed Clinical Social Worker

## 2019-07-10 DIAGNOSIS — F439 Reaction to severe stress, unspecified: Secondary | ICD-10-CM

## 2019-07-10 DIAGNOSIS — I10 Essential (primary) hypertension: Secondary | ICD-10-CM

## 2019-07-10 NOTE — Chronic Care Management (AMB) (Signed)
  Care Management   Follow Up Note   07/10/2019 Name: Caroline Turner MRN: [id number] DOB: 27-Dec-1970  Referred by: Tarri Fuller, FNP Reason for referral : Care Coordination   Caroline Turner is a 49 y.o. year old female who is a primary care patient of Tarri Fuller, FNP. The care management team was consulted for assistance with care management and care coordination needs.    Review of patient status, including review of consultants reports, relevant laboratory and other test results, and collaboration with appropriate care team members and the patient's provider was performed as part of comprehensive patient evaluation and provision of chronic care management services.    SDOH (Social Determinants of Health) assessments performed: Yes See Care Plan activities for detailed interventions related to [facility])     Advanced Directives: See Care Plan and Vynca application for related entries.   Goals Addressed    . SW: I want to work on getting POA for my ill mother (pt-stated)       CARE PLAN ENTRY (see longtitudinal plan of care for additional care plan information)  Current Barriers:  . Limited social support . ADL IADL limitations . Family and relationship dysfunction . Social Isolation . Limited education about POA information completion*  Clinical Social Work Clinical Goal(s):  Marland Kitchen Over the next 120 days, patient will demonstrate improved health management independence as evidenced by gaining appropriate education/resources in order to start the POA process for her mother  Interventions: . Patient interviewed and appropriate assessments performed . Provided patient with information about how to complete POA documentation. Family understands that patient will need to not only be agreeable to [facility] but also have the capacity to agree to this. Patient shares that she will start this conversation with family soon. Family was also encouraged to look into getting a lawyer for this process. LCSW  sent email to patient with resources, education, POA documentation and a list of local lawyers in the area of her mother ([place], [place])   . Discussed plans with patient for ongoing care management follow up and provided patient with direct contact information for care management team . Advised patient to contact CCM if she has any concerns that arise during the POA completion process. . Assisted patient/caregiver with obtaining information about health plan benefits . Provided education and assistance to client regarding Advanced Directives.  Patient Self Care Activities:  . Attends all scheduled provider appointments . Calls provider office for new concerns or questions . Lacks social connections  Initial goal documentation     The care management team will reach out to the patient again over the next quarter.  Dickie La, BSW, MSW, LCSW [facility] [place]  [facility] [email or web address] Phone: [phone]

## 2019-08-02 ENCOUNTER — Encounter: Payer: Self-pay | Admitting: *Deleted

## 2019-08-23 ENCOUNTER — Ambulatory Visit: Payer: Self-pay | Admitting: Licensed Clinical Social Worker

## 2019-08-23 NOTE — Chronic Care Management (AMB) (Signed)
Care Management   Follow Up Note   08/23/2019 Name: Caroline Turner MRN: [id number] DOB: 01-18-1971  Referred by: Verl Bangs, FNP Reason for referral : Care Coordination   Caroline Turner is a 49 y.o. year old female who is a primary care patient of Verl Bangs, FNP. The care management team was consulted for assistance with care management and care coordination needs.    Review of patient status, including review of consultants reports, relevant laboratory and other test results, and collaboration with appropriate care team members and the patient's provider was performed as part of comprehensive patient evaluation and provision of chronic care management services.    SDOH (Social Determinants of Health) assessments performed: Yes See Care Plan activities for detailed interventions related to [facility])     Advanced Directives: See Care Plan and Vynca application for related entries.   Goals Addressed    . SW: I want more resource education (pt-stated)       CARE PLAN ENTRY (see longtitudinal plan of care for additional care plan information)  Current Barriers:  . Limited social support . ADL IADL limitations . Family and relationship dysfunction . Social Isolation . Limited education about POA information completion*  Clinical Social Work Clinical Goal(s):  Marland Kitchen Over the next 120 days, patient will demonstrate improved health management independence as evidenced by gaining appropriate education/resources in order to improve overall quality of life and health. . Over the next 120 days, patient will demonstrate improved health management independence as evidenced by implementing healthy self-care and positive support/resources into her daily routine to cope with stressors and improve mental and physical health.  Interventions: . Patient interviewed and appropriate assessments performed . Provided patient with information about how to complete POA documentation. Family understands that  patient will need to not only be agreeable to [facility] but also have the capacity to agree to this. Patient shares that she will start this conversation with family soon. Family was also encouraged to look into getting a lawyer for this process. LCSW sent email to patient with resources, education, POA documentation and a list of local lawyers in the area of her mother ([place], [place]) Update- Patient reports that her mother is coming to visit her soon and she will start this open dialogue/conversation. Patient is hopeful that her mother will be agreeable. Patient confirmed receiving resources in regards to this matter. . Discussed plans with patient for ongoing care management follow up and provided patient with direct contact information for care management team . Advised patient to contact CCM if she has any concerns that arise during the POA completion process. . Assisted patient/caregiver with obtaining information about health plan benefits . Provided education and assistance to client regarding Advanced Directives. Marland Kitchen Update- Patient reports ongoing martial conflict and is interested in where she can receive couples counseling. LCSW sent patient a secure email with available resources and highlighted the ones that offer couples therapy. However, patient may have to use spouses' insurance as he has Medicaid. Patient appreciative of resource education that was provided.  Marland Kitchen LCSW discussed coping skills for family dysfunction and stress. SW used empathetic and active and reflective listening, validated patient's feelings/concerns, and provided emotional support. LCSW provided self-care education to help manage her multiple health conditions and improve her mood.   Patient Self Care Activities:  . Attends all scheduled provider appointments . Calls provider office for new concerns or questions . Lacks social connections  Please see past updates related to this goal by  clicking on the "Past Updates"  button in the selected goal       The care management team will reach out to the patient again over the next quarter.  Dickie La, BSW, MSW, LCSW [facility]   [facility] [email or web address] Phone: [phone]

## 2019-09-11 ENCOUNTER — Other Ambulatory Visit: Payer: Self-pay

## 2019-09-11 ENCOUNTER — Encounter: Payer: Self-pay | Admitting: Plastic Surgery

## 2019-09-11 ENCOUNTER — Ambulatory Visit (INDEPENDENT_AMBULATORY_CARE_PROVIDER_SITE_OTHER): Payer: No Typology Code available for payment source | Admitting: Plastic Surgery

## 2019-09-11 DIAGNOSIS — M546 Pain in thoracic spine: Secondary | ICD-10-CM

## 2019-09-11 DIAGNOSIS — G8929 Other chronic pain: Secondary | ICD-10-CM | POA: Diagnosis not present

## 2019-09-11 DIAGNOSIS — N62 Hypertrophy of breast: Secondary | ICD-10-CM | POA: Diagnosis not present

## 2019-09-11 DIAGNOSIS — M549 Dorsalgia, unspecified: Secondary | ICD-10-CM | POA: Insufficient documentation

## 2019-09-11 DIAGNOSIS — M542 Cervicalgia: Secondary | ICD-10-CM | POA: Insufficient documentation

## 2019-09-11 NOTE — Progress Notes (Signed)
Patient ID: Caroline Turner, female    DOB: 02-Jul-1970, 49 y.o.   MRN: [id number]   Chief Complaint  Patient presents with  . Advice Only    for (B) breast reduction  . Breast Problem    Mammary Hyperplasia: The patient is a 49 y.o. female with a history of mammary hyperplasia for several years.  She has extremely large breasts causing symptoms that include the following: Back pain in the upper and lower back, including neck pain. She pulls or pins her bra straps to provide better lift and relief of the pressure and pain. She notices relief by holding her breast up manually.  Her shoulder straps cause grooves and pain and pressure that requires padding for relief. Pain medication is sometimes required with motrin and tylenol.  Activities that are hindered by enlarged breasts include: exercise and running.   Her breasts are extremely large and fairly symmetric.  She has hyperpigmentation of the inframammary area on both sides.  The sternal to nipple distance on the right is 34 cm and the left is 34 cm.  The IMF distance is 14 cm.  She is 5 feet 3 inches tall and weighs 221 pounds.  Preoperative bra size = 44 D cup. Would like to be a C cup.  The estimated excess breast tissue to be removed at the time of surgery = 650 grams on the left and 650 grams on the right.  Mammogram history: needs a mammogram.  Has not had any physical therapy.  She does not have a family history of breast cancer of plantar fasciitis, cholecystectomy and gastroesophageal reflux she is not a smoker.   Review of Systems  Constitutional: Positive for activity change. Negative for appetite change.  HENT: Negative.   Eyes: Negative.   Respiratory: Negative.   Cardiovascular: Negative.   Gastrointestinal: Negative.   Endocrine: Negative.   Genitourinary: Negative.   Musculoskeletal: Positive for back pain and neck pain.  Skin: Negative for color change.  Hematological: Negative.   Psychiatric/Behavioral: Negative.      Past Medical History:  Diagnosis Date  . History of swelling of feet   . Hyperlipidemia   . Hypertension   . Kidney stones     Past Surgical History:  Procedure Laterality Date  . CHOLECYSTECTOMY    . CHOLECYSTECTOMY    . NEPHROLITHOTOMY    . PARTIAL HYSTERECTOMY    . SHOULDER SURGERY Left       Current Outpatient Medications:  .  Cholecalciferol (VITAMIN D-3 PO), Take by mouth., Disp: , Rfl:  .  hydrochlorothiazide (HYDRODIURIL) 25 MG tablet, Take 1 tablet (25 mg total) by mouth daily., Disp: 90 tablet, Rfl: 0 .  vitamin B-12 (CYANOCOBALAMIN) 100 MCG tablet, Take 100 mcg by mouth daily., Disp: , Rfl:  .  Apple Cider Vinegar 188 MG CAPS, Take by mouth., Disp: , Rfl:  .  desoximetasone (TOPICORT) 0.25 % cream, Apply 1 application topically 2 (two) times daily., Disp: 30 g, Rfl: 0 .  Ergocalciferol (VITAMIN D2) 50 MCG (2000 UT) TABS, Take by mouth., Disp: , Rfl:  .  polyethylene glycol powder (GLYCOLAX/MIRALAX) 17 GM/SCOOP powder, Take 17 g by mouth 2 (two) times daily as needed., Disp: 3350 g, Rfl: 1 .  SUMAtriptan (IMITREX) 25 MG tablet, Take 1 tablet (25 mg total) by mouth every 2 (two) hours as needed for migraine. May repeat in 2 hours if headache persists or recurs. (Patient not taking: Reported on 06/27/2018), Disp: 10 tablet, Rfl: 0  Objective:   Vitals:   09/11/19 1457  BP: (!) 145/89  Pulse: 75  Temp: (!) 97.5 F (36.4 C)  SpO2: 98%    Physical Exam Vitals and nursing note reviewed.  Constitutional:      Appearance: Normal appearance.  HENT:     Head: Normocephalic and atraumatic.  Eyes:     Extraocular Movements: Extraocular movements intact.  Cardiovascular:     Rate and Rhythm: Normal rate.  Pulmonary:     Effort: Pulmonary effort is normal. No respiratory distress.  Abdominal:     General: Abdomen is flat. There is no distension.  Skin:    General: Skin is warm.     Capillary Refill: Capillary refill takes less than 2 seconds.  Neurological:      General: No focal deficit present.     Mental Status: She is alert and oriented to person, place, and time.  Psychiatric:        Mood and Affect: Mood normal.        Behavior: Behavior normal.        Thought Content: Thought content normal.     Assessment & Plan:  Symptomatic mammary hypertrophy  Chronic bilateral thoracic back pain  Neck pain Recommend bilateral breast reduction with possible bilateral liposuction. Will send for physical therapy and mammogram.   Pictures were obtained of the patient and placed in the chart with the patient's or guardian's permission.  [place], DO

## 2019-09-14 ENCOUNTER — Ambulatory Visit
Admission: RE | Admit: 2019-09-14 | Discharge: 2019-09-14 | Disposition: A | Discharge status: Home / Self Care | Payer: No Typology Code available for payment source | Source: Ambulatory Visit

## 2019-09-14 ENCOUNTER — Other Ambulatory Visit: Payer: Self-pay

## 2019-09-14 ENCOUNTER — Other Ambulatory Visit: Payer: Self-pay | Admitting: Family Medicine

## 2019-09-14 DIAGNOSIS — Z1231 Encounter for screening mammogram for malignant neoplasm of breast: Secondary | ICD-10-CM

## 2019-09-18 ENCOUNTER — Other Ambulatory Visit: Payer: Self-pay | Admitting: Family Medicine

## 2019-09-18 DIAGNOSIS — R928 Other abnormal and inconclusive findings on diagnostic imaging of breast: Secondary | ICD-10-CM

## 2019-09-25 ENCOUNTER — Other Ambulatory Visit: Payer: Self-pay

## 2019-09-25 ENCOUNTER — Ambulatory Visit: Payer: No Typology Code available for payment source | Attending: Plastic Surgery | Admitting: Physical Therapy

## 2019-09-25 ENCOUNTER — Encounter: Payer: Self-pay | Admitting: Physical Therapy

## 2019-09-25 DIAGNOSIS — M25512 Pain in left shoulder: Secondary | ICD-10-CM | POA: Insufficient documentation

## 2019-09-25 DIAGNOSIS — M546 Pain in thoracic spine: Secondary | ICD-10-CM

## 2019-09-25 DIAGNOSIS — M542 Cervicalgia: Secondary | ICD-10-CM | POA: Diagnosis present

## 2019-09-25 DIAGNOSIS — G8929 Other chronic pain: Secondary | ICD-10-CM | POA: Diagnosis present

## 2019-09-25 DIAGNOSIS — M6281 Muscle weakness (generalized): Secondary | ICD-10-CM | POA: Diagnosis present

## 2019-09-25 DIAGNOSIS — R293 Abnormal posture: Secondary | ICD-10-CM | POA: Diagnosis present

## 2019-09-25 NOTE — Patient Instructions (Signed)
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
## 2019-09-27 ENCOUNTER — Ambulatory Visit
Admission: RE | Admit: 2019-09-27 | Discharge: 2019-09-27 | Disposition: A | Discharge status: Home / Self Care | Payer: No Typology Code available for payment source | Source: Ambulatory Visit | Attending: Family Medicine | Admitting: Family Medicine

## 2019-09-27 ENCOUNTER — Other Ambulatory Visit: Payer: Self-pay

## 2019-09-27 DIAGNOSIS — R928 Other abnormal and inconclusive findings on diagnostic imaging of breast: Secondary | ICD-10-CM

## 2019-09-29 ENCOUNTER — Other Ambulatory Visit: Payer: Self-pay | Admitting: Family Medicine

## 2019-09-29 DIAGNOSIS — I10 Essential (primary) hypertension: Secondary | ICD-10-CM

## 2019-09-29 NOTE — Telephone Encounter (Signed)
90 day courtesy RF to last until future appt Requested Prescriptions  Pending Prescriptions Disp Refills  . hydrochlorothiazide (HYDRODIURIL) 25 MG tablet [Pharmacy Med Name: HYDROCHLOROTHIAZIDE 25MG  TABLETS] 90 tablet 0    Sig: TAKE 1 TABLET(25 MG) BY MOUTH DAILY     Cardiovascular: Diuretics - Thiazide Failed - 09/29/2019 10:30 AM      Failed - Last BP in normal range    BP Readings from Last 1 Encounters:  09/11/19 (!) 145/89         Failed - Valid encounter within last 6 months    Recent Outpatient Visits          2 months ago Vitamin D deficiency   [facility], FNP   1 year ago Encounter for annual physical exam   [facility], NP   1 year ago Encounter to establish care   [facility], Kyung Rudd, NP      Future Appointments            In 3 months Malfi, Alison Stalling, FNP [facility]           Passed - Ca in normal range and within 360 days    Calcium  Date Value Ref Range Status  07/03/2019 9.3 8.6 - 10.4 mg/dL Final         Passed - Cr in normal range and within 360 days    Creat  Date Value Ref Range Status  07/03/2019 0.56 0.50 - 1.05 mg/dL Final    Comment:    For patients >52 years of age, the reference limit for Creatinine is approximately 13% higher for people identified as African-American. .          Passed - K in normal range and within 360 days    Potassium  Date Value Ref Range Status  07/03/2019 4.0 3.5 - 5.3 mmol/L Final         Passed - Na in normal range and within 360 days    Sodium  Date Value Ref Range Status  07/03/2019 141 135 - 146 mmol/L Final

## 2019-10-01 ENCOUNTER — Other Ambulatory Visit: Payer: Self-pay

## 2019-10-01 ENCOUNTER — Ambulatory Visit: Payer: No Typology Code available for payment source | Attending: Plastic Surgery

## 2019-10-01 DIAGNOSIS — M542 Cervicalgia: Secondary | ICD-10-CM | POA: Diagnosis present

## 2019-10-01 DIAGNOSIS — R293 Abnormal posture: Secondary | ICD-10-CM | POA: Diagnosis present

## 2019-10-01 DIAGNOSIS — M6281 Muscle weakness (generalized): Secondary | ICD-10-CM | POA: Diagnosis present

## 2019-10-01 DIAGNOSIS — M546 Pain in thoracic spine: Secondary | ICD-10-CM | POA: Insufficient documentation

## 2019-10-02 ENCOUNTER — Ambulatory Visit: Payer: Self-pay

## 2019-10-02 NOTE — Chronic Care Management (AMB) (Signed)
  Care Management   Follow Up Note   10/02/2019 Name: [name] MRN: [id number] DOB: June 11, 1970  Referred by: Tarri Fuller, FNP Reason for referral : Care Coordination   Caroline Turner is a 49 y.o. year old female who is a primary care patient of Tarri Fuller, FNP. The care management team was consulted for assistance with care management and care coordination needs.    Review of patient status, including review of consultants reports, relevant laboratory and other test results, and collaboration with appropriate care team members and the patient's provider was performed as part of comprehensive patient evaluation and provision of chronic care management services.    LCSW completed CCM outreach attempt on 10/02/19 and patient successfully answered but stated that she was unable to talk any further with LCSW today. LCSW rescheduled CCM SW appointment per patient's request.  Dickie La, BSW, MSW, LCSW [facility]  [facility] [email or web address] Phone: [phone]

## 2019-10-02 NOTE — Therapy (Signed)
[place] [facility] MAIN [facility] SERVICES [address] [place], [place], [address] Phone: [phone]   Fax:  [phone]  Physical Therapy Treatment  Patient Details  Name: Caroline Turner MRN: [id number] Date of Birth: [birth year] Referring Provider (PT): Dr Foster Simpson   Encounter Date: 10/01/2019  PT End of Session - 10/02/19 0640    Visit Number  2    Number of Visits  4    Date for PT Re-Evaluation  10/23/19    Authorization Type  [facility] plan    PT Start Time  0945    PT Stop Time  1015    PT Time Calculation (min)  30 min    Activity Tolerance  Patient limited by pain    Behavior During Therapy  [facility] for tasks assessed/performed       Past Medical History:  Diagnosis Date  . History of swelling of feet   . Hyperlipidemia   . Hypertension   . Kidney stones     Past Surgical History:  Procedure Laterality Date  . CHOLECYSTECTOMY    . CHOLECYSTECTOMY    . NEPHROLITHOTOMY    . PARTIAL HYSTERECTOMY    . SHOULDER SURGERY Left     There were no vitals filed for this visit.  Subjective Assessment - 10/02/19 0636    Subjective  Pt reports she has been doing her home exercises.  She still c/o L shoulder pain and cervical discomfort.  Pt arrived 15 min late for appointment stating that she forgot what time her appointment was.    Pertinent History  Lt shoulder surgery < 10 yrs ago for labral tear    Diagnostic tests  none    Patient Stated Goals  reduce pain    Currently in Pain?  Yes    Pain Score  5     Pain Location  Shoulder    Pain Orientation  Left    Pain Descriptors / Indicators  Aching    Pain Type  Chronic pain    Pain Onset  More than a month ago    Pain Frequency  Constant         Treatment:  Therapeutic Exercises:  Nustep L4 x 4 min for warm up; f/b standing corner pec stretches 4x20 sec; stdg wall slides into L shoulder flexion with wash cloth 10x; attempted wall angels but pt unable to perform due to  L shoulder pain and loss of L shoulder abd; chin tucks against wall in standing 20x with focus on improved upper body postural alignment. Standing rows with RTB 20x; stdg B shldr ER with RTB and chin tuck 20x.  Hooklying thoracic outlet stretch over foam bolster x 2 min. Educated pt in how to make lumbar towel roll to use in sitting to improve overall upper body postural alignment and decrease cervical/thoracic strain.                       PT Education - 10/02/19 [phone]    Education Details  HEP- added thoracic outlet stretch lying on foam roller; pec corner stretch; rows with RTB; lumbar towel roll in sitting for improved postural alignment    Person(s) Educated  Patient    Methods  Explanation;Demonstration;Verbal cues;Tactile cues    Comprehension  Verbalized understanding;Returned demonstration          PT Long Term Goals - 09/25/19 1100      PT LONG TERM GOAL #1   Title  I with advanced HEP    Time  4    Period  Weeks    Status  New    Target Date  10/23/19      PT LONG TERM GOAL #2   Title  improve FOTO =/< 35% limited    Time  4    Period  Weeks    Status  New    Target Date  10/23/19      PT LONG TERM GOAL #3   Title  increase upper back strength =/> 4+/5 to help  support upright posture4    Time  4    Period  Weeks    Status  New    Target Date  10/23/19      PT LONG TERM GOAL #4   Title  report =/> 50% reduction of thoracic and cervical pain after working on her computer    Time  4    Period  Weeks    Status  New    Target Date  10/23/19      PT LONG TERM GOAL #5   Title  report =/> 75% reduction in Lt shoulder pain with reaching overhead    Time  4    Period  Weeks    Status  New    Target Date  10/23/19            Plan - 10/02/19 0641    Clinical Impression Statement  Worked a great deal on opening thoracic outlet during today's session and improving L shoulder ROM.  Pt has limited and painful AROM of L shoulder in all planes  actively due to history of labral tear.  She did report today that a shelf fell on her L shoulder not too long along and reinjured the area (she has had no imaging since that incident).  Plan to continue to working on opening thoracic outlet, improving thoracic mobility and L shoulder ROM, and on improving overall upper body postural alignment.    Personal Factors and Comorbidities  Comorbidity 2    Comorbidities  Lt shoulder surgery , HTN    Examination-Activity Limitations  Bathing;Sit;Sleep;Dressing;Lift    Examination-Participation Restrictions  Cleaning;Community Activity;Other    Stability/Clinical Decision Making  Stable/Uncomplicated    Clinical Decision Making  Low    Rehab Potential  Fair    PT Frequency  1x / week    PT Duration  4 weeks    PT Treatment/Interventions  Iontophoresis 4mg /ml Dexamethasone;Taping;Patient/family education;Moist Heat;Therapeutic exercise;Electrical Stimulation;Manual techniques;Dry needling;Spinal Manipulations    PT Next Visit Plan  assess response to ionto Lt shoulder, cont with thoracic mobilization, upper back and RTC strengthening    Consulted and Agree with Plan of Care  Patient       Patient will benefit from skilled therapeutic intervention in order to improve the following deficits and impairments:  Decreased range of motion, Pain, Impaired UE functional use, Hypomobility, Decreased strength  Visit Diagnosis: Pain in thoracic spine  Cervicalgia  Muscle weakness (generalized)  Abnormal posture     Problem List Patient Active Problem List   Diagnosis Date Noted  . Symptomatic mammary hypertrophy 09/11/2019  . Back pain 09/11/2019  . Neck pain 09/11/2019  . Routine medical exam 07/03/2019  . Trigger middle finger of right hand 09/19/2018  . Kidney stones 03/10/2018  . Hypertension 03/10/2018  . Congenital malformation of left renal vein 03/10/2018  . Benign essential hypertension 10/07/2017  . Cervical high risk HPV (human  papillomavirus) test positive 10/07/2017  .  Gastroesophageal reflux disease 07/25/2014  . Plantar fasciitis 04/17/2013  . Fatty liver 05/05/2010  . Vitamin D deficiency 04/30/2010  . Elevated liver enzymes 04/07/2010  . Hypertriglyceridemia 04/07/2010  . Morbid obesity (HCC) 04/07/2010  . S/P hysterectomy 12/02/2009  . S/P hysterectomy 12/02/2009    Chevonne Bostrom, MPT 10/02/2019, 6:49 AM  [place] [facility] MAIN [facility] SERVICES [address] [place], [place], [address] Phone: [phone]   Fax:  [phone]  Name: Caroline Turner MRN: [id number] Date of Birth: Jun 27, 1970

## 2019-10-03 ENCOUNTER — Ambulatory Visit: Payer: No Typology Code available for payment source

## 2019-10-08 ENCOUNTER — Ambulatory Visit: Payer: No Typology Code available for payment source

## 2019-10-10 ENCOUNTER — Ambulatory Visit: Payer: No Typology Code available for payment source

## 2019-10-15 ENCOUNTER — Ambulatory Visit: Payer: No Typology Code available for payment source

## 2019-10-17 ENCOUNTER — Ambulatory Visit: Payer: No Typology Code available for payment source

## 2019-10-30 ENCOUNTER — Ambulatory Visit: Payer: Self-pay

## 2019-10-30 NOTE — Chronic Care Management (AMB) (Signed)
  Care Management   Follow Up Note   10/30/2019 Name: Manmeet Arzola MRN: [id number] DOB: January 24, 1971  Referred by: Tarri Fuller, FNP Reason for referral : Care Coordination   Delainee Tramel is a 49 y.o. year old female who is a primary care patient of Tarri Fuller, FNP. The care management team was consulted for assistance with care management and care coordination needs.    Review of patient status, including review of consultants reports, relevant laboratory and other test results, and collaboration with appropriate care team members and the patient's provider was performed as part of comprehensive patient evaluation and provision of chronic care management services.    LCSW completed CCM outreach attempt today but was unable to reach patient successfully. A HIPPA compliant voice message was left encouraging patient to return call once available. LCSW rescheduled CCM SW appointment as well.  A HIPPA compliant phone message was left for the patient providing contact information and requesting a return call.   Dickie La, BSW, MSW, LCSW [facility] [place]  [facility] [email or web address] Phone: [phone]

## 2019-11-08 NOTE — Progress Notes (Signed)
Patient ID: Caroline Turner, female    DOB: [birth year], 49 y.o.   MRN: [id number]  Chief Complaint  Patient presents with  . Pre-op Exam      ICD-10-CM   1. Symptomatic mammary hypertrophy  N62   2. Chronic bilateral thoracic back pain  M54.6    G89.29   3. Neck pain  M54.2   4. Tobacco abuse disorder  Z72.0 Nicotine/cotinine metabolites    History of Present Illness: Caroline Turner is a 49 y.o.  female  with a history of mammary hypertrophy.  She presents for preoperative evaluation for upcoming procedure, bilateral breast reduction with possible liposuction, scheduled for 12/06/19 with Dr. Ulice Bold  The patient has not had problems with anesthesia. No history of DVT/PE.  No family history of DVT/PE.  No family or personal history of bleeding or clotting disorders.  Patient is not currently taking any blood thinners.  No history of CVA/MI.   Summary of Previous Visit: Breasts are extremely large and fairly symmetric, hyperpigmentation on the inframammary area.  STN on the right is 34 and STN on the left is 34.  IMF is 14 cm.  Patient is 5 feet 3 inches tall and weighs 221 pounds.  Preop bra size is 44 D cup.  Patient would like to be a C cup.  Estimated excess tissue to be removed at the time of surgery 650 g on the right and 650 g on the left.  Job: Works at [employer]  PMH Significant for: HLD, HTN. SurgHx of cholecystectomy, partial hysterectomy, shoulder surgery.  Patient reports she is an occasional smoker, approximately 1 pack per every 3 weeks, reports her last time smoking was approximately 2 weeks ago.   Past Medical History: Allergies: No Known Allergies  Current Medications:  Current Outpatient Medications:  .  Cholecalciferol (VITAMIN D-3 PO), Take by mouth., Disp: , Rfl:  .  hydrochlorothiazide (HYDRODIURIL) 25 MG tablet, TAKE 1 TABLET(25 MG) BY MOUTH DAILY, Disp: 90 tablet, Rfl: 0 .  vitamin B-12 (CYANOCOBALAMIN) 100 MCG tablet, Take  100 mcg by mouth daily., Disp: , Rfl:  .  Apple Cider Vinegar 188 MG CAPS, Take by mouth. (Patient not taking: Reported on 11/09/2019), Disp: , Rfl:  .  desoximetasone (TOPICORT) 0.25 % cream, Apply 1 application topically 2 (two) times daily. (Patient not taking: Reported on 11/09/2019), Disp: 30 g, Rfl: 0 .  Ergocalciferol (VITAMIN D2) 50 MCG (2000 UT) TABS, Take by mouth. (Patient not taking: Reported on 11/09/2019), Disp: , Rfl:  .  polyethylene glycol powder (GLYCOLAX/MIRALAX) 17 GM/SCOOP powder, Take 17 g by mouth 2 (two) times daily as needed. (Patient not taking: Reported on 09/25/2019), Disp: 3350 g, Rfl: 1 .  SUMAtriptan (IMITREX) 25 MG tablet, Take 1 tablet (25 mg total) by mouth every 2 (two) hours as needed for migraine. May repeat in 2 hours if headache persists or recurs. (Patient not taking: Reported on 09/25/2019), Disp: 10 tablet, Rfl: 0  Past Medical Problems: Past Medical History:  Diagnosis Date  . History of swelling of feet   . Hyperlipidemia   . Hypertension   . Kidney stones     Past Surgical History: Past Surgical History:  Procedure Laterality Date  . CHOLECYSTECTOMY    . CHOLECYSTECTOMY    . NEPHROLITHOTOMY    . PARTIAL HYSTERECTOMY    . SHOULDER SURGERY Left     Social History: Social History   Socioeconomic History  . Marital status: Married  Spouse name: Not on file  . Number of children: 3  . Years of education: Not on file  . Highest education level: Master's degree (e.g., MA, MS, MEng, MEd, MSW, MBA)  Occupational History  . Not on file  Tobacco Use  . Smoking status: Current Every Day Smoker    Packs/day: 0.10  . Smokeless tobacco: Never Used  . Tobacco comment: 5 cigarettes a week  Vaping Use  . Vaping Use: Never used  Substance and Sexual Activity  . Alcohol use: Yes    Alcohol/week: 1.0 standard drink    Types: 1 Cans of beer per week    Comment: occasionally  . Drug use: Never  . Sexual activity: Yes  Other Topics Concern  . Not on  file  Social History Narrative  . Not on file   Social Determinants of Health   Financial Resource Strain:   . Difficulty of Paying Living Expenses:   Food Insecurity:   . Worried About Programme researcher, broadcasting/film/video in the Last Year:   . Barista in the Last Year:   Transportation Needs:   . Freight forwarder (Medical):   Marland Kitchen Lack of Transportation (Non-Medical):   Physical Activity:   . Days of Exercise per Week:   . Minutes of Exercise per Session:   Stress:   . Feeling of Stress :   Social Connections:   . Frequency of Communication with Friends and Family:   . Frequency of Social Gatherings with Friends and Family:   . Attends Religious Services:   . Active Member of Clubs or Organizations:   . Attends Banker Meetings:   Marland Kitchen Marital Status:   Intimate Partner Violence:   . Fear of Current or Ex-Partner:   . Emotionally Abused:   Marland Kitchen Physically Abused:   . Sexually Abused:     Family History: Family History  Problem Relation Age of Onset  . Cancer Father        lung, liver, mets to brain  . Dementia Father   . Vascular Disease Mother   . Dementia Mother        vascular dementia  . Alzheimer's disease Mother     Review of Systems: Review of Systems  Constitutional: Negative.   Respiratory: Negative.   Cardiovascular: Negative.   Gastrointestinal: Negative.     Physical Exam: Vital Signs BP 139/70 (BP Location: Right Arm, Patient Position: Sitting, Cuff Size: Large)   Pulse 85   Ht 5\' 3"  (1.6 m)   Wt 218 lb 9.6 oz (99.2 kg)   SpO2 97%   BMI 38.72 kg/m  Physical Exam Exam conducted with a chaperone present.  Constitutional:      General: She is not in acute distress.    Appearance: Normal appearance. She is not ill-appearing.  HENT:     Head: Normocephalic and atraumatic.  Eyes:     Pupils: Pupils are equal, round Neck:     Musculoskeletal: Normal range of motion.  Cardiovascular:     Rate and Rhythm: Normal rate and regular rhythm.       Pulses: Normal pulses.     Heart sounds: Normal heart sounds. No murmur.  Pulmonary:     Effort: Pulmonary effort is normal. No respiratory distress.     Breath sounds: Normal breath sounds. No wheezing.  Abdominal:     General: Abdomen is flat. There is no distension.     Palpations: Abdomen is soft.     Tenderness:  There is no abdominal tenderness.  Musculoskeletal: Normal range of motion.  Skin:    General: Skin is warm and dry.     Findings: No erythema or rash.  Neurological:     General: No focal deficit present.     Mental Status: She is alert and oriented to person, place, and time. Mental status is at baseline.     Motor: No weakness.  Psychiatric:        Mood and Affect: Mood normal.        Behavior: Behavior normal.     Assessment/Plan: Patient is scheduled for bilateral breast reduction with possible liposuction with Dr. Ulice Boldillingham.  Risks, benefits, and alternatives of procedure discussed, questions answered and consent obtained.    Smoking Status: Current smoker, last cigarette 2 weeks ago, reports she will not smoke prior to surgery or after surgery for at least 6 weeks and will complete nicotine test.; Counseling Given?  Yes, discussed risks associated with nicotine use and increased risk of nipple areolar necrosis, poor wound healing.  Patient reports that she will stop smoking and will not smoke for at least 6 weeks after surgery.  She understands the risks associated with smoking and is in agreement with obtaining a nicotine test during the first week of August prior to surgery.  She understands that if the test is positive we will need to postpone surgery.  Last Mammogram: 09/14/2019 which required diagnostic mammogram of right breast and U/S of right axilla.; Results: US showed mass in right breast showing correspondence with a benign lymph node. No malignancy of L breast.  Caprini Score: 4; Risk Factors include: Age, BMI greater than 25, and length of planned  surgery. Recommendation for mechanical prophylaxis during surgery. Encourage early ambulation.   Pictures obtained: 09/11/19  Post-op Rx sent to pharmacy: No prescription sent to the pharmacy as patient will need to have a negative nicotine test prior.  Will send Norco, Keflex, Zofran once patient's nicotine test returned negative.  Patient was provided with the breast reduction and General Surgical Risk consent document and Pain Medication Agreement prior to their appointment.  They had adequate time to read through the risk consent documents and Pain Medication Agreement. We also discussed them in person together during this preop appointment. All of their questions were answered to their satisfaction.  Recommended calling if they have any further questions.  Risk consent form and Pain Medication Agreement to be scanned into patient's chart.  The risk that can be encountered with breast reduction were discussed and include the following but not limited to these:  Breast asymmetry, fluid accumulation, firmness of the breast, inability to breast feed, loss of nipple or areola, skin loss, decrease or no nipple sensation, fat necrosis of the breast tissue, bleeding, infection, healing delay.  There are risks of anesthesia, changes to skin sensation and injury to nerves or blood vessels.  The muscle can be temporarily or permanently injured.  You may have an allergic reaction to tape, suture, glue, blood products which can result in skin discoloration, swelling, pain, skin lesions, poor healing.  Any of these can lead to the need for revisonal surgery or stage procedures.  A reduction has potential to interfere with diagnostic procedures.  Nipple or breast piercing can increase risks of infection.  This procedure is best done when the breast is fully developed.  Changes in the breast will continue to occur over time.  Pregnancy can alter the outcomes of previous breast reduction surgery, weight gain and  weigh  loss can also effect the long term appearance.     Electronically signed by: Kermit Balo Loys Hoselton, PA-C 11/09/2019 10:07 AM

## 2019-11-08 NOTE — H&P (View-Only) (Signed)
Patient ID: Caroline Turner, female    DOB: [birth year], 49 y.o.   MRN: [id number]  Chief Complaint  Patient presents with  . Pre-op Exam      ICD-10-CM   1. Symptomatic mammary hypertrophy  N62   2. Chronic bilateral thoracic back pain  M54.6    G89.29   3. Neck pain  M54.2   4. Tobacco abuse disorder  Z72.0 Nicotine/cotinine metabolites    History of Present Illness: Caroline Turner is a 49 y.o.  female  with a history of mammary hypertrophy.  She presents for preoperative evaluation for upcoming procedure, bilateral breast reduction with possible liposuction, scheduled for 12/06/19 with Dr. Ulice Bold  The patient has not had problems with anesthesia. No history of DVT/PE.  No family history of DVT/PE.  No family or personal history of bleeding or clotting disorders.  Patient is not currently taking any blood thinners.  No history of CVA/MI.   Summary of Previous Visit: Breasts are extremely large and fairly symmetric, hyperpigmentation on the inframammary area.  STN on the right is 34 and STN on the left is 34.  IMF is 14 cm.  Patient is 5 feet 3 inches tall and weighs 221 pounds.  Preop bra size is 44 D cup.  Patient would like to be a C cup.  Estimated excess tissue to be removed at the time of surgery 650 g on the right and 650 g on the left.  Job: Works at [employer]  PMH Significant for: HLD, HTN. SurgHx of cholecystectomy, partial hysterectomy, shoulder surgery.  Patient reports she is an occasional smoker, approximately 1 pack per every 3 weeks, reports her last time smoking was approximately 2 weeks ago.   Past Medical History: Allergies: No Known Allergies  Current Medications:  Current Outpatient Medications:  .  Cholecalciferol (VITAMIN D-3 PO), Take by mouth., Disp: , Rfl:  .  hydrochlorothiazide (HYDRODIURIL) 25 MG tablet, TAKE 1 TABLET(25 MG) BY MOUTH DAILY, Disp: 90 tablet, Rfl: 0 .  vitamin B-12 (CYANOCOBALAMIN) 100 MCG tablet, Take  100 mcg by mouth daily., Disp: , Rfl:  .  Apple Cider Vinegar 188 MG CAPS, Take by mouth. (Patient not taking: Reported on 11/09/2019), Disp: , Rfl:  .  desoximetasone (TOPICORT) 0.25 % cream, Apply 1 application topically 2 (two) times daily. (Patient not taking: Reported on 11/09/2019), Disp: 30 g, Rfl: 0 .  Ergocalciferol (VITAMIN D2) 50 MCG (2000 UT) TABS, Take by mouth. (Patient not taking: Reported on 11/09/2019), Disp: , Rfl:  .  polyethylene glycol powder (GLYCOLAX/MIRALAX) 17 GM/SCOOP powder, Take 17 g by mouth 2 (two) times daily as needed. (Patient not taking: Reported on 09/25/2019), Disp: 3350 g, Rfl: 1 .  SUMAtriptan (IMITREX) 25 MG tablet, Take 1 tablet (25 mg total) by mouth every 2 (two) hours as needed for migraine. May repeat in 2 hours if headache persists or recurs. (Patient not taking: Reported on 09/25/2019), Disp: 10 tablet, Rfl: 0  Past Medical Problems: Past Medical History:  Diagnosis Date  . History of swelling of feet   . Hyperlipidemia   . Hypertension   . Kidney stones     Past Surgical History: Past Surgical History:  Procedure Laterality Date  . CHOLECYSTECTOMY    . CHOLECYSTECTOMY    . NEPHROLITHOTOMY    . PARTIAL HYSTERECTOMY    . SHOULDER SURGERY Left     Social History: Social History   Socioeconomic History  . Marital status: Married  Spouse name: Not on file  . Number of children: 3  . Years of education: Not on file  . Highest education level: Master's degree (e.g., MA, MS, MEng, MEd, MSW, MBA)  Occupational History  . Not on file  Tobacco Use  . Smoking status: Current Every Day Smoker    Packs/day: 0.10  . Smokeless tobacco: Never Used  . Tobacco comment: 5 cigarettes a week  Vaping Use  . Vaping Use: Never used  Substance and Sexual Activity  . Alcohol use: Yes    Alcohol/week: 1.0 standard drink    Types: 1 Cans of beer per week    Comment: occasionally  . Drug use: Never  . Sexual activity: Yes  Other Topics Concern  . Not on  file  Social History Narrative  . Not on file   Social Determinants of Health   Financial Resource Strain:   . Difficulty of Paying Living Expenses:   Food Insecurity:   . Worried About Programme researcher, broadcasting/film/video in the Last Year:   . Barista in the Last Year:   Transportation Needs:   . Freight forwarder (Medical):   Marland Kitchen Lack of Transportation (Non-Medical):   Physical Activity:   . Days of Exercise per Week:   . Minutes of Exercise per Session:   Stress:   . Feeling of Stress :   Social Connections:   . Frequency of Communication with Friends and Family:   . Frequency of Social Gatherings with Friends and Family:   . Attends Religious Services:   . Active Member of Clubs or Organizations:   . Attends Banker Meetings:   Marland Kitchen Marital Status:   Intimate Partner Violence:   . Fear of Current or Ex-Partner:   . Emotionally Abused:   Marland Kitchen Physically Abused:   . Sexually Abused:     Family History: Family History  Problem Relation Age of Onset  . Cancer Father        lung, liver, mets to brain  . Dementia Father   . Vascular Disease Mother   . Dementia Mother        vascular dementia  . Alzheimer's disease Mother     Review of Systems: Review of Systems  Constitutional: Negative.   Respiratory: Negative.   Cardiovascular: Negative.   Gastrointestinal: Negative.     Physical Exam: Vital Signs BP 139/70 (BP Location: Right Arm, Patient Position: Sitting, Cuff Size: Large)   Pulse 85   Ht 5\' 3"  (1.6 m)   Wt 218 lb 9.6 oz (99.2 kg)   SpO2 97%   BMI 38.72 kg/m  Physical Exam Exam conducted with a chaperone present.  Constitutional:      General: She is not in acute distress.    Appearance: Normal appearance. She is not ill-appearing.  HENT:     Head: Normocephalic and atraumatic.  Eyes:     Pupils: Pupils are equal, round Neck:     Musculoskeletal: Normal range of motion.  Cardiovascular:     Rate and Rhythm: Normal rate and regular rhythm.       Pulses: Normal pulses.     Heart sounds: Normal heart sounds. No murmur.  Pulmonary:     Effort: Pulmonary effort is normal. No respiratory distress.     Breath sounds: Normal breath sounds. No wheezing.  Abdominal:     General: Abdomen is flat. There is no distension.     Palpations: Abdomen is soft.     Tenderness:  There is no abdominal tenderness.  Musculoskeletal: Normal range of motion.  Skin:    General: Skin is warm and dry.     Findings: No erythema or rash.  Neurological:     General: No focal deficit present.     Mental Status: She is alert and oriented to person, place, and time. Mental status is at baseline.     Motor: No weakness.  Psychiatric:        Mood and Affect: Mood normal.        Behavior: Behavior normal.     Assessment/Plan: Patient is scheduled for bilateral breast reduction with possible liposuction with Dr. Dillingham.  Risks, benefits, and alternatives of procedure discussed, questions answered and consent obtained.    Smoking Status: Current smoker, last cigarette 2 weeks ago, reports she will not smoke prior to surgery or after surgery for at least 6 weeks and will complete nicotine test.; Counseling Given?  Yes, discussed risks associated with nicotine use and increased risk of nipple areolar necrosis, poor wound healing.  Patient reports that she will stop smoking and will not smoke for at least 6 weeks after surgery.  She understands the risks associated with smoking and is in agreement with obtaining a nicotine test during the first week of August prior to surgery.  She understands that if the test is positive we will need to postpone surgery.  Last Mammogram: 09/14/2019 which required diagnostic mammogram of right breast and U/S of right axilla.; Results: US showed mass in right breast showing correspondence with a benign lymph node. No malignancy of L breast.  Caprini Score: 4; Risk Factors include: Age, BMI greater than 25, and length of planned  surgery. Recommendation for mechanical prophylaxis during surgery. Encourage early ambulation.   Pictures obtained: 09/11/19  Post-op Rx sent to pharmacy: No prescription sent to the pharmacy as patient will need to have a negative nicotine test prior.  Will send Norco, Keflex, Zofran once patient's nicotine test returned negative.  Patient was provided with the breast reduction and General Surgical Risk consent document and Pain Medication Agreement prior to their appointment.  They had adequate time to read through the risk consent documents and Pain Medication Agreement. We also discussed them in person together during this preop appointment. All of their questions were answered to their satisfaction.  Recommended calling if they have any further questions.  Risk consent form and Pain Medication Agreement to be scanned into patient's chart.  The risk that can be encountered with breast reduction were discussed and include the following but not limited to these:  Breast asymmetry, fluid accumulation, firmness of the breast, inability to breast feed, loss of nipple or areola, skin loss, decrease or no nipple sensation, fat necrosis of the breast tissue, bleeding, infection, healing delay.  There are risks of anesthesia, changes to skin sensation and injury to nerves or blood vessels.  The muscle can be temporarily or permanently injured.  You may have an allergic reaction to tape, suture, glue, blood products which can result in skin discoloration, swelling, pain, skin lesions, poor healing.  Any of these can lead to the need for revisonal surgery or stage procedures.  A reduction has potential to interfere with diagnostic procedures.  Nipple or breast piercing can increase risks of infection.  This procedure is best done when the breast is fully developed.  Changes in the breast will continue to occur over time.  Pregnancy can alter the outcomes of previous breast reduction surgery, weight gain and   weigh  loss can also effect the long term appearance.     Electronically signed by: Kermit Balo Samayra Hebel, PA-C 11/09/2019 10:07 AM

## 2019-11-09 ENCOUNTER — Ambulatory Visit (INDEPENDENT_AMBULATORY_CARE_PROVIDER_SITE_OTHER): Payer: No Typology Code available for payment source | Admitting: Surgical

## 2019-11-09 ENCOUNTER — Other Ambulatory Visit: Payer: Self-pay

## 2019-11-09 ENCOUNTER — Encounter: Payer: Self-pay | Admitting: Surgical

## 2019-11-09 VITALS — BP 139/70 | HR 85 | Ht 63.0 in | Wt 218.6 lb

## 2019-11-09 DIAGNOSIS — G8929 Other chronic pain: Secondary | ICD-10-CM

## 2019-11-09 DIAGNOSIS — M542 Cervicalgia: Secondary | ICD-10-CM

## 2019-11-09 DIAGNOSIS — N62 Hypertrophy of breast: Secondary | ICD-10-CM

## 2019-11-09 DIAGNOSIS — M546 Pain in thoracic spine: Secondary | ICD-10-CM

## 2019-11-09 DIAGNOSIS — Z720 Tobacco use: Secondary | ICD-10-CM

## 2019-11-28 ENCOUNTER — Other Ambulatory Visit: Payer: Self-pay

## 2019-11-28 ENCOUNTER — Encounter (HOSPITAL_BASED_OUTPATIENT_CLINIC_OR_DEPARTMENT_OTHER): Payer: Self-pay | Admitting: Plastic Surgery

## 2019-12-03 ENCOUNTER — Other Ambulatory Visit (HOSPITAL_COMMUNITY): Payer: No Typology Code available for payment source

## 2019-12-03 LAB — NICOTINE/COTININE METABOLITES
Cotinine: <1 ng/mL
Nicotine: <1 ng/mL

## 2019-12-04 ENCOUNTER — Other Ambulatory Visit: Payer: Self-pay

## 2019-12-04 ENCOUNTER — Encounter (HOSPITAL_BASED_OUTPATIENT_CLINIC_OR_DEPARTMENT_OTHER)
Admission: RE | Admit: 2019-12-04 | Discharge: 2019-12-04 | Disposition: A | Discharge status: Home / Self Care | Payer: No Typology Code available for payment source | Source: Ambulatory Visit | Attending: Plastic Surgery | Admitting: Plastic Surgery

## 2019-12-04 ENCOUNTER — Other Ambulatory Visit (HOSPITAL_COMMUNITY)
Admission: RE | Admit: 2019-12-04 | Discharge: 2019-12-04 | Disposition: A | Discharge status: Home / Self Care | Payer: No Typology Code available for payment source | Source: Ambulatory Visit | Attending: Plastic Surgery | Admitting: Plastic Surgery

## 2019-12-04 DIAGNOSIS — Z01818 Encounter for other preprocedural examination: Secondary | ICD-10-CM | POA: Insufficient documentation

## 2019-12-04 DIAGNOSIS — Z20822 Contact with and (suspected) exposure to covid-19: Secondary | ICD-10-CM | POA: Insufficient documentation

## 2019-12-04 DIAGNOSIS — Z01812 Encounter for preprocedural laboratory examination: Secondary | ICD-10-CM | POA: Insufficient documentation

## 2019-12-04 LAB — BASIC METABOLIC PANEL
Anion gap: 10 (ref 5–15)
BUN: 14 mg/dL (ref 6–20)
CO2: 27 mmol/L (ref 22–32)
Calcium: 9.2 mg/dL (ref 8.9–10.3)
Chloride: 101 mmol/L (ref 98–111)
Creatinine, Ser: 0.47 mg/dL (ref 0.44–1.00)
GFR calc Af Amer: >60 mL/min (ref >60)
GFR calc non Af Amer: >60 mL/min (ref >60)
Glucose, Bld: 96 mg/dL (ref 70–99)
Potassium: 3.6 mmol/L (ref 3.5–5.1)
Sodium: 138 mmol/L (ref 135–145)

## 2019-12-04 LAB — SARS CORONAVIRUS 2 (TAT 6-24 HRS): SARS Coronavirus 2: NEGATIVE

## 2019-12-04 MED ORDER — CHLORHEXIDINE GLUCONATE CLOTH 2 % EX PADS: 6.0000 | MEDICATED_PAD | Freq: Once | CUTANEOUS | Status: DC

## 2019-12-04 NOTE — Progress Notes (Signed)
Surgical soap given to patient, instructions given, patient verbalized understanding.   

## 2019-12-06 ENCOUNTER — Encounter (HOSPITAL_BASED_OUTPATIENT_CLINIC_OR_DEPARTMENT_OTHER): Payer: Self-pay | Admitting: Plastic Surgery

## 2019-12-06 ENCOUNTER — Encounter (HOSPITAL_BASED_OUTPATIENT_CLINIC_OR_DEPARTMENT_OTHER)
Admission: RE | Disposition: A | Discharge status: Home / Self Care | Payer: Self-pay | Source: Home / Self Care | Attending: Plastic Surgery

## 2019-12-06 ENCOUNTER — Ambulatory Visit (HOSPITAL_BASED_OUTPATIENT_CLINIC_OR_DEPARTMENT_OTHER): Payer: No Typology Code available for payment source | Admitting: Anesthesiology

## 2019-12-06 ENCOUNTER — Other Ambulatory Visit (INDEPENDENT_AMBULATORY_CARE_PROVIDER_SITE_OTHER): Payer: No Typology Code available for payment source | Admitting: Plastic Surgery

## 2019-12-06 ENCOUNTER — Other Ambulatory Visit: Payer: Self-pay

## 2019-12-06 ENCOUNTER — Ambulatory Visit (HOSPITAL_BASED_OUTPATIENT_CLINIC_OR_DEPARTMENT_OTHER)
Admission: RE | Admit: 2019-12-06 | Discharge: 2019-12-06 | Disposition: A | Discharge status: Home / Self Care | Payer: No Typology Code available for payment source | Attending: Plastic Surgery | Admitting: Plastic Surgery

## 2019-12-06 ENCOUNTER — Other Ambulatory Visit: Payer: Self-pay | Admitting: Plastic Surgery

## 2019-12-06 DIAGNOSIS — F17210 Nicotine dependence, cigarettes, uncomplicated: Secondary | ICD-10-CM | POA: Diagnosis not present

## 2019-12-06 DIAGNOSIS — M542 Cervicalgia: Secondary | ICD-10-CM

## 2019-12-06 DIAGNOSIS — G8929 Other chronic pain: Secondary | ICD-10-CM

## 2019-12-06 DIAGNOSIS — I10 Essential (primary) hypertension: Secondary | ICD-10-CM | POA: Insufficient documentation

## 2019-12-06 DIAGNOSIS — M549 Dorsalgia, unspecified: Secondary | ICD-10-CM | POA: Diagnosis not present

## 2019-12-06 DIAGNOSIS — N62 Hypertrophy of breast: Secondary | ICD-10-CM | POA: Insufficient documentation

## 2019-12-06 DIAGNOSIS — Z9889 Other specified postprocedural states: Secondary | ICD-10-CM

## 2019-12-06 DIAGNOSIS — M546 Pain in thoracic spine: Secondary | ICD-10-CM

## 2019-12-06 DIAGNOSIS — Z79899 Other long term (current) drug therapy: Secondary | ICD-10-CM | POA: Insufficient documentation

## 2019-12-06 HISTORY — DX: Other specified postprocedural states: R11.2

## 2019-12-06 HISTORY — DX: Other specified postprocedural states: Z98.890

## 2019-12-06 HISTORY — DX: Nausea with vomiting, unspecified: R11.2

## 2019-12-06 HISTORY — PX: BREAST REDUCTION SURGERY: SHX8

## 2019-12-06 SURGERY — BREAST REDUCTION WITH LIPOSUCTION
Anesthesia: General | Site: Breast | Laterality: Bilateral

## 2019-12-06 MED ORDER — HYDROMORPHONE HCL 1 MG/ML IJ SOLN
0.2500 mg | INTRAMUSCULAR | Status: DC | PRN
  Administered 2019-12-06 (×2): 0.5 mg via INTRAVENOUS

## 2019-12-06 MED ORDER — MEPERIDINE HCL 25 MG/ML IJ SOLN: 6.2500 mg | INTRAMUSCULAR | Status: DC | PRN

## 2019-12-06 MED ORDER — PROMETHAZINE HCL 25 MG/ML IJ SOLN: 6.2500 mg | INTRAMUSCULAR | Status: DC | PRN

## 2019-12-06 MED ORDER — SODIUM CHLORIDE 0.9 % IV SOLN: 250.0000 mL | INTRAVENOUS | Status: DC | PRN

## 2019-12-06 MED ORDER — DEXAMETHASONE SODIUM PHOSPHATE 4 MG/ML IJ SOLN
INTRAMUSCULAR | Status: DC | PRN
  Administered 2019-12-06: 10 mg via INTRAVENOUS

## 2019-12-06 MED ORDER — FENTANYL CITRATE (PF) 100 MCG/2ML IJ SOLN
INTRAMUSCULAR | Status: AC
  Filled 2019-12-06: qty 2

## 2019-12-06 MED ORDER — OXYCODONE HCL 5 MG/5ML PO SOLN: 5.0000 mg | Freq: Once | ORAL | Status: DC | PRN

## 2019-12-06 MED ORDER — ROCURONIUM BROMIDE 100 MG/10ML IV SOLN
INTRAVENOUS | Status: DC | PRN
  Administered 2019-12-06 (×2): 20 mg via INTRAVENOUS
  Administered 2019-12-06: 100 mg via INTRAVENOUS

## 2019-12-06 MED ORDER — EPINEPHRINE PF 1 MG/ML IJ SOLN
INTRAMUSCULAR | Status: AC
  Filled 2019-12-06: qty 1

## 2019-12-06 MED ORDER — MIDAZOLAM HCL 5 MG/5ML IJ SOLN
INTRAMUSCULAR | Status: DC | PRN
  Administered 2019-12-06: 2 mg via INTRAVENOUS

## 2019-12-06 MED ORDER — HYDROCODONE-ACETAMINOPHEN 5-325 MG PO TABS: 1.0000 | ORAL_TABLET | Freq: Three times a day (TID) | ORAL | 0 refills | Status: DC | PRN

## 2019-12-06 MED ORDER — FENTANYL CITRATE (PF) 100 MCG/2ML IJ SOLN: 12.5000 ug | INTRAMUSCULAR | Status: DC | PRN

## 2019-12-06 MED ORDER — AMISULPRIDE (ANTIEMETIC) 5 MG/2ML IV SOLN
INTRAVENOUS | Status: AC
  Filled 2019-12-06: qty 4

## 2019-12-06 MED ORDER — PROPOFOL 10 MG/ML IV BOLUS
INTRAVENOUS | Status: DC | PRN
  Administered 2019-12-06: 150 mg via INTRAVENOUS

## 2019-12-06 MED ORDER — LIDOCAINE-EPINEPHRINE 1 %-1:100000 IJ SOLN
INTRAMUSCULAR | Status: DC | PRN
  Administered 2019-12-06: 20 mL

## 2019-12-06 MED ORDER — SODIUM CHLORIDE 0.9% FLUSH: 3.0000 mL | Freq: Two times a day (BID) | INTRAVENOUS | Status: DC

## 2019-12-06 MED ORDER — CEFAZOLIN SODIUM-DEXTROSE 2-4 GM/100ML-% IV SOLN
INTRAVENOUS | Status: AC
  Filled 2019-12-06: qty 100

## 2019-12-06 MED ORDER — HYDROCODONE-ACETAMINOPHEN 5-325 MG PO TABS: 1.0000 | ORAL_TABLET | Freq: Three times a day (TID) | ORAL | 0 refills | Status: AC | PRN

## 2019-12-06 MED ORDER — SUGAMMADEX SODIUM 500 MG/5ML IV SOLN
INTRAVENOUS | Status: DC | PRN
  Administered 2019-12-06: 200 mg via INTRAVENOUS

## 2019-12-06 MED ORDER — LIDOCAINE-EPINEPHRINE 1 %-1:100000 IJ SOLN
INTRAMUSCULAR | Status: AC
  Filled 2019-12-06: qty 1

## 2019-12-06 MED ORDER — CEPHALEXIN 500 MG PO CAPS
500.0000 mg | ORAL_CAPSULE | Freq: Four times a day (QID) | ORAL | 0 refills | Status: DC
Start: 2019-12-06 — End: 2019-12-06

## 2019-12-06 MED ORDER — SUCCINYLCHOLINE CHLORIDE 200 MG/10ML IV SOSY
PREFILLED_SYRINGE | INTRAVENOUS | Status: AC
  Filled 2019-12-06: qty 10

## 2019-12-06 MED ORDER — LACTATED RINGERS IV SOLN
INTRAVENOUS | Status: AC | PRN
  Administered 2019-12-06: 1000 mL via INTRAVENOUS

## 2019-12-06 MED ORDER — OXYCODONE HCL 5 MG PO TABS: 5.0000 mg | ORAL_TABLET | Freq: Once | ORAL | Status: DC | PRN

## 2019-12-06 MED ORDER — DIPHENHYDRAMINE HCL 50 MG/ML IJ SOLN
INTRAMUSCULAR | Status: DC | PRN
  Administered 2019-12-06: 12.5 mg via INTRAVENOUS

## 2019-12-06 MED ORDER — CEPHALEXIN 500 MG PO CAPS
500.0000 mg | ORAL_CAPSULE | Freq: Four times a day (QID) | ORAL | 0 refills | Status: AC
Start: 2019-12-06 — End: 2019-12-09

## 2019-12-06 MED ORDER — EPINEPHRINE PF 1 MG/ML IJ SOLN
INTRAMUSCULAR | Status: DC | PRN
  Administered 2019-12-06: 1 mg

## 2019-12-06 MED ORDER — DEXAMETHASONE SODIUM PHOSPHATE 10 MG/ML IJ SOLN
INTRAMUSCULAR | Status: AC
  Filled 2019-12-06: qty 1

## 2019-12-06 MED ORDER — ROCURONIUM BROMIDE 10 MG/ML (PF) SYRINGE
PREFILLED_SYRINGE | INTRAVENOUS | Status: AC
  Filled 2019-12-06: qty 10

## 2019-12-06 MED ORDER — MIDAZOLAM HCL 2 MG/2ML IJ SOLN
INTRAMUSCULAR | Status: AC
  Filled 2019-12-06: qty 2

## 2019-12-06 MED ORDER — IBUPROFEN 600 MG PO TABS
600.0000 mg | ORAL_TABLET | Freq: Four times a day (QID) | ORAL | 0 refills | Status: DC | PRN
Start: 2019-12-06 — End: 2020-11-26

## 2019-12-06 MED ORDER — LIDOCAINE HCL 1 % IJ SOLN
INTRAMUSCULAR | Status: DC | PRN
  Administered 2019-12-06: 50 mL

## 2019-12-06 MED ORDER — HYDROMORPHONE HCL 1 MG/ML IJ SOLN
INTRAMUSCULAR | Status: AC
  Filled 2019-12-06: qty 0.5

## 2019-12-06 MED ORDER — SCOPOLAMINE 1 MG/3DAYS TD PT72
MEDICATED_PATCH | TRANSDERMAL | Status: AC
  Filled 2019-12-06: qty 1

## 2019-12-06 MED ORDER — ACETAMINOPHEN 500 MG PO TABS: 500.0000 mg | ORAL_TABLET | Freq: Four times a day (QID) | ORAL | 0 refills | Status: AC | PRN

## 2019-12-06 MED ORDER — ARTIFICIAL TEARS OPHTHALMIC OINT
TOPICAL_OINTMENT | OPHTHALMIC | Status: AC
  Filled 2019-12-06: qty 3.5

## 2019-12-06 MED ORDER — PROPOFOL 500 MG/50ML IV EMUL
INTRAVENOUS | Status: AC
  Filled 2019-12-06: qty 50

## 2019-12-06 MED ORDER — LIDOCAINE HCL (CARDIAC) PF 100 MG/5ML IV SOSY
PREFILLED_SYRINGE | INTRAVENOUS | Status: DC | PRN
  Administered 2019-12-06: 50 mg via INTRAVENOUS

## 2019-12-06 MED ORDER — ONDANSETRON HCL 4 MG/2ML IJ SOLN
INTRAMUSCULAR | Status: AC
  Filled 2019-12-06: qty 2

## 2019-12-06 MED ORDER — EPHEDRINE 5 MG/ML INJ
INTRAVENOUS | Status: AC
  Filled 2019-12-06: qty 10

## 2019-12-06 MED ORDER — ACETAMINOPHEN 325 MG PO TABS: 650.0000 mg | ORAL_TABLET | ORAL | Status: DC | PRN

## 2019-12-06 MED ORDER — PHENYLEPHRINE 40 MCG/ML (10ML) SYRINGE FOR IV PUSH (FOR BLOOD PRESSURE SUPPORT)
PREFILLED_SYRINGE | INTRAVENOUS | Status: AC
  Filled 2019-12-06: qty 10

## 2019-12-06 MED ORDER — LIDOCAINE HCL (PF) 1 % IJ SOLN
INTRAMUSCULAR | Status: AC
  Filled 2019-12-06: qty 30

## 2019-12-06 MED ORDER — DIPHENHYDRAMINE HCL 50 MG/ML IJ SOLN
INTRAMUSCULAR | Status: AC
  Filled 2019-12-06: qty 1

## 2019-12-06 MED ORDER — AMISULPRIDE (ANTIEMETIC) 5 MG/2ML IV SOLN
10.0000 mg | Freq: Once | INTRAVENOUS | Status: AC | PRN
  Administered 2019-12-06: 10 mg via INTRAVENOUS

## 2019-12-06 MED ORDER — LACTATED RINGERS IV SOLN: INTRAVENOUS | Status: DC

## 2019-12-06 MED ORDER — FENTANYL CITRATE (PF) 100 MCG/2ML IJ SOLN
INTRAMUSCULAR | Status: DC | PRN
  Administered 2019-12-06: 50 ug via INTRAVENOUS
  Administered 2019-12-06: 100 ug via INTRAVENOUS
  Administered 2019-12-06 (×4): 50 ug via INTRAVENOUS

## 2019-12-06 MED ORDER — NITROGLYCERIN 2 % TD OINT
TOPICAL_OINTMENT | TRANSDERMAL | Status: DC | PRN
  Administered 2019-12-06: .5 [in_us] via TOPICAL

## 2019-12-06 MED ORDER — SODIUM CHLORIDE 0.9% FLUSH: 3.0000 mL | INTRAVENOUS | Status: DC | PRN

## 2019-12-06 MED ORDER — BUPIVACAINE HCL (PF) 0.25 % IJ SOLN
INTRAMUSCULAR | Status: AC
  Filled 2019-12-06: qty 30

## 2019-12-06 MED ORDER — SUGAMMADEX SODIUM 500 MG/5ML IV SOLN
INTRAVENOUS | Status: AC
  Filled 2019-12-06: qty 5

## 2019-12-06 MED ORDER — ONDANSETRON HCL 4 MG PO TABS: 4.0000 mg | ORAL_TABLET | Freq: Three times a day (TID) | ORAL | 0 refills | Status: DC | PRN

## 2019-12-06 MED ORDER — DROPERIDOL 2.5 MG/ML IJ SOLN
INTRAMUSCULAR | Status: DC | PRN
  Administered 2019-12-06: .625 mg via INTRAVENOUS

## 2019-12-06 MED ORDER — ACETAMINOPHEN 325 MG RE SUPP: 650.0000 mg | RECTAL | Status: DC | PRN

## 2019-12-06 MED ORDER — CEFAZOLIN SODIUM-DEXTROSE 2-4 GM/100ML-% IV SOLN
2.0000 g | INTRAVENOUS | Status: AC
  Administered 2019-12-06: 2 g via INTRAVENOUS

## 2019-12-06 MED ORDER — OXYCODONE HCL 5 MG PO TABS: 5.0000 mg | ORAL_TABLET | ORAL | Status: DC | PRN

## 2019-12-06 MED ORDER — DROPERIDOL 2.5 MG/ML IJ SOLN
INTRAMUSCULAR | Status: AC
  Filled 2019-12-06: qty 2

## 2019-12-06 MED ORDER — BUPIVACAINE HCL 0.25 % IJ SOLN
INTRAMUSCULAR | Status: DC | PRN
  Administered 2019-12-06: 30 mL

## 2019-12-06 MED ORDER — LIDOCAINE 2% (20 MG/ML) 5 ML SYRINGE
INTRAMUSCULAR | Status: AC
  Filled 2019-12-06: qty 5

## 2019-12-06 MED FILL — ONDANSETRON HCL 4 MG TABLET: 4 | 7 days supply | Qty: 20 | Fill #0

## 2019-12-06 MED FILL — CEPHALEXIN 500 MG CAPSULE: 500 | 3 days supply | Qty: 12 | Fill #0

## 2019-12-06 MED FILL — HYDROCODON-APAP 5-325: 5-325 | 7 days supply | Qty: 21 | Fill #0

## 2019-12-06 SURGICAL SUPPLY — 68 items
ADH SKN CLS APL DERMABOND .7 (GAUZE/BANDAGES/DRESSINGS) ×3
BAG DECANTER FOR FLEXI CONT (MISCELLANEOUS) ×2 IMPLANT
BINDER BREAST LRG (GAUZE/BANDAGES/DRESSINGS) IMPLANT
BINDER BREAST MEDIUM (GAUZE/BANDAGES/DRESSINGS) IMPLANT
BINDER BREAST XLRG (GAUZE/BANDAGES/DRESSINGS) IMPLANT
BINDER BREAST XXLRG (GAUZE/BANDAGES/DRESSINGS) ×2 IMPLANT
BIOPATCH RED 1 DISK 7.0 (GAUZE/BANDAGES/DRESSINGS) ×4 IMPLANT
BLADE HEX COATED 2.75 (ELECTRODE) ×2 IMPLANT
BLADE KNIFE PERSONA 10 (BLADE) ×4 IMPLANT
BLADE SURG 15 STRL LF DISP TIS (BLADE) ×1 IMPLANT
BLADE SURG 15 STRL SS (BLADE) ×2
BNDG GAUZE ELAST 4 BULKY (GAUZE/BANDAGES/DRESSINGS) IMPLANT
CANISTER SUCT 1200ML W/VALVE (MISCELLANEOUS) ×2 IMPLANT
COVER BACK TABLE 60X90IN (DRAPES) ×2 IMPLANT
COVER MAYO STAND STRL (DRAPES) ×2 IMPLANT
COVER WAND RF STERILE (DRAPES) IMPLANT
DECANTER SPIKE VIAL GLASS SM (MISCELLANEOUS) ×2 IMPLANT
DERMABOND ADVANCED (GAUZE/BANDAGES/DRESSINGS) ×3
DERMABOND ADVANCED .7 DNX12 (GAUZE/BANDAGES/DRESSINGS) ×3 IMPLANT
DRAIN CHANNEL 19F RND (DRAIN) ×4 IMPLANT
DRAPE LAPAROSCOPIC ABDOMINAL (DRAPES) ×2 IMPLANT
DRSG OPSITE POSTOP 4X12 (GAUZE/BANDAGES/DRESSINGS) ×4 IMPLANT
DRSG OPSITE POSTOP 4X6 (GAUZE/BANDAGES/DRESSINGS) IMPLANT
DRSG PAD ABDOMINAL 8X10 ST (GAUZE/BANDAGES/DRESSINGS) ×4 IMPLANT
ELECT BLADE 4.0 EZ CLEAN MEGAD (MISCELLANEOUS) ×2
ELECT REM PT RETURN 9FT ADLT (ELECTROSURGICAL) ×2
ELECTRODE BLDE 4.0 EZ CLN MEGD (MISCELLANEOUS) ×1 IMPLANT
ELECTRODE REM PT RTRN 9FT ADLT (ELECTROSURGICAL) ×1 IMPLANT
EVACUATOR SILICONE 100CC (DRAIN) ×4 IMPLANT
GLOVE BIO SURGEON STRL SZ 6.5 (GLOVE) ×14 IMPLANT
GLOVE BIOGEL PI IND STRL 7.0 (GLOVE) ×5 IMPLANT
GLOVE BIOGEL PI INDICATOR 7.0 (GLOVE) ×5
GOWN STRL REUS W/ TWL LRG LVL3 (GOWN DISPOSABLE) ×5 IMPLANT
GOWN STRL REUS W/TWL LRG LVL3 (GOWN DISPOSABLE) ×10
NDL SAFETY ECLIPSE 18X1.5 (NEEDLE) IMPLANT
NEEDLE HYPO 18GX1.5 SHARP (NEEDLE)
NEEDLE HYPO 25X1 1.5 SAFETY (NEEDLE) IMPLANT
NS IRRIG 1000ML POUR BTL (IV SOLUTION) ×2 IMPLANT
PACK BASIN DAY SURGERY FS (CUSTOM PROCEDURE TRAY) ×2 IMPLANT
PAD ALCOHOL SWAB (MISCELLANEOUS) ×2 IMPLANT
PENCIL SMOKE EVACUATOR (MISCELLANEOUS) ×2 IMPLANT
PIN SAFETY STERILE (MISCELLANEOUS) ×2 IMPLANT
SLEEVE SCD COMPRESS KNEE MED (MISCELLANEOUS) ×2 IMPLANT
SPONGE LAP 18X18 RF (DISPOSABLE) ×8 IMPLANT
STRIP SUTURE WOUND CLOSURE 1/2 (MISCELLANEOUS) ×4 IMPLANT
SUT MNCRL AB 4-0 PS2 18 (SUTURE) ×24 IMPLANT
SUT MON AB 3-0 SH 27 (SUTURE) ×16
SUT MON AB 3-0 SH27 (SUTURE) ×8 IMPLANT
SUT MON AB 5-0 PS2 18 (SUTURE) ×10 IMPLANT
SUT PDS 3-0 CT2 (SUTURE)
SUT PDS AB 2-0 CT2 27 (SUTURE) IMPLANT
SUT PDS II 3-0 CT2 27 ABS (SUTURE) IMPLANT
SUT SILK 3 0 PS 1 (SUTURE) ×4 IMPLANT
SUT VIC AB 3-0 SH 27 (SUTURE)
SUT VIC AB 3-0 SH 27X BRD (SUTURE) IMPLANT
SUT VICRYL 4-0 PS2 18IN ABS (SUTURE) IMPLANT
SYR 3ML 23GX1 SAFETY (SYRINGE) ×2 IMPLANT
SYR 50ML LL SCALE MARK (SYRINGE) ×2 IMPLANT
SYR BULB IRRIG 60ML STRL (SYRINGE) ×2 IMPLANT
SYR CONTROL 10ML LL (SYRINGE) ×2 IMPLANT
TAPE MEASURE VINYL STERILE (MISCELLANEOUS) IMPLANT
TOWEL GREEN STERILE FF (TOWEL DISPOSABLE) ×4 IMPLANT
TRAY DSU PREP LF (CUSTOM PROCEDURE TRAY) ×2 IMPLANT
TUBE CONNECTING 20X1/4 (TUBING) ×2 IMPLANT
TUBING INFILTRATION IT-10001 (TUBING) ×2 IMPLANT
TUBING SET GRADUATE ASPIR 12FT (MISCELLANEOUS) ×2 IMPLANT
UNDERPAD 30X36 HEAVY ABSORB (UNDERPADS AND DIAPERS) ×4 IMPLANT
YANKAUER SUCT BULB TIP NO VENT (SUCTIONS) ×2 IMPLANT

## 2019-12-06 NOTE — Interval H&P Note (Signed)
History and Physical Interval Note:  12/06/2019 7:20 AM  Caroline Turner  has presented today for surgery, with the diagnosis of mammary hypertrophy.  The various methods of treatment have been discussed with the patient and family. After consideration of risks, benefits and other options for treatment, the patient has consented to  Procedure(s) with comments: BREAST REDUCTION WITH LIPOSUCTION (Bilateral) - 3.5 hours, please as a surgical intervention.  The patient's history has been reviewed, patient examined, no change in status, stable for surgery.  I have reviewed the patient's chart and labs.  Questions were answered to the patient's satisfaction.     Alena Bills Seretha Estabrooks

## 2019-12-06 NOTE — Anesthesia Preprocedure Evaluation (Signed)
Anesthesia Evaluation  Patient identified by MRN, date of birth, ID band Patient awake    Reviewed: Allergy & Precautions, NPO status , Patient's Chart, lab work & pertinent test results  History of Anesthesia Complications (+) PONV  Airway Mallampati: II  TM Distance: >3 FB Neck ROM: Full    Dental no notable dental hx.    Pulmonary neg pulmonary ROS, Current Smoker and Patient abstained from smoking.,    Pulmonary exam normal breath sounds clear to auscultation       Cardiovascular hypertension, Pt. on medications negative cardio ROS Normal cardiovascular exam Rhythm:Regular Rate:Normal     Neuro/Psych negative neurological ROS  negative psych ROS   GI/Hepatic Neg liver ROS, GERD  ,  Endo/Other  negative endocrine ROS  Renal/GU negative Renal ROS  negative genitourinary   Musculoskeletal negative musculoskeletal ROS (+)   Abdominal (+) + obese,   Peds negative pediatric ROS (+)  Hematology negative hematology ROS (+)   Anesthesia Other Findings   Reproductive/Obstetrics negative OB ROS                             Anesthesia Physical Anesthesia Plan  ASA: II  Anesthesia Plan: General   Post-op Pain Management:    Induction: Intravenous  PONV Risk Score and Plan: 3 and Ondansetron, Dexamethasone, Midazolam and Treatment may vary due to age or medical condition  Airway Management Planned: Oral ETT  Additional Equipment:   Intra-op Plan:   Post-operative Plan: Extubation in OR  Informed Consent: I have reviewed the patients History and Physical, chart, labs and discussed the procedure including the risks, benefits and alternatives for the proposed anesthesia with the patient or authorized representative who has indicated his/her understanding and acceptance.     Dental advisory given  Plan Discussed with: CRNA  Anesthesia Plan Comments:         Anesthesia  Quick Evaluation

## 2019-12-06 NOTE — Op Note (Signed)
Breast Reduction Op note:    DATE OF PROCEDURE: 12/06/2019  LOCATION: [facility]  SURGEON: Alan Ripper Sanger Coree Brame, DO  ASSISTANT: Joni Fears, PA  PREOPERATIVE DIAGNOSIS 1. Macromastia 2. Neck Pain 3. Back Pain  POSTOPERATIVE DIAGNOSIS 1. Macromastia 2. Neck Pain 3. Back Pain  PROCEDURES 1. Bilateral breast reduction.  Right reduction 1044 g, Left reduction 1174 g  COMPLICATIONS: None.  DRAINS: bilateral  INDICATIONS FOR PROCEDURE Caroline Turner is a 49 y.o. year-old female born on 09-24-1970,with a history of symptomatic macromastia with concominant back pain, neck pain, shoulder grooving from her bra.   MRN: [id number]  CONSENT Informed consent was obtained directly from the patient. The risks, benefits and alternatives were fully discussed. Specific risks including but not limited to bleeding, infection, hematoma, seroma, scarring, pain, nipple necrosis, asymmetry, poor cosmetic results, and need for further surgery were discussed. The patient had ample opportunity to have her questions answered to her satisfaction.  DESCRIPTION OF PROCEDURE  Patient was brought into the operating room and placed in a supine position.  SCDs were placed and appropriate padding was performed.  Antibiotics were given. The patient underwent general anesthesia and the chest was prepped and draped in a sterile fashion.  A timeout was performed and all information was confirmed to be correct.  Tumescent was placed in the lateral and inferior portion of the breast.  Right side: Preoperative markings were confirmed.  Incision lines were injected with 1% Xylocaine with epinephrine.  After waiting for vasoconstriction, the marked lines were incised.  Liposuction was performed in the lateral aspect of the breast. A Wise-pattern superomedial breast reduction was performed by de-epithelializing the pedicle, using bovie to create the superomedial pedicle, and removing breast tissue  from the superior, lateral, and inferior portions of the breast.  Care was taken to not undermine the breast pedicle. Hemostasis was achieved.  The nipple was gently rotated into position and the soft tissue closed with 4-0 Monocryl.   The pocket was irrigated and hemostasis confirmed.  The deep tissues were approximated with 3-0 monocryl sutures and the skin was closed with deep dermal and subcuticular 4-0 Monocryl sutures.  The nipple and skin flaps had good capillary refill at the end of the procedure.    Left side: Preoperative markings were confirmed.  Incision lines were injected with 1% Xylocaine with epinephrine.  After waiting for vasoconstriction, the marked lines were incised.  Liposuction was performed in the lateral aspect of the breast. A Wise-pattern superomedial breast reduction was performed by de-epithelializing the pedicle, using bovie to create the superomedial pedicle, and removing breast tissue from the superior, lateral, and inferior portions of the breast.  Care was taken to not undermine the breast pedicle. Hemostasis was achieved.  The nipple was gently rotated into position and the soft tissue was closed with 4-0 Monocryl.  The patient was sat upright and size and shape symmetry was confirmed.  The pocket was irrigated and hemostasis confirmed.  The deep tissues were approximated with 3-0 monocryl sutures and the skin was closed with deep dermal and subcuticular 4-0 Monocryl sutures.  Dermabond was applied.  A breast binder and ABDs were placed.  The nipple and skin flaps had good capillary refill at the end of the procedure.  The patient tolerated the procedure well. The patient was allowed to wake from anesthesia and taken to the recovery room in satisfactory condition.  The advanced practice practitioner (APP) assisted throughout the case.  The APP was essential in retraction  and counter traction when needed to make the case progress smoothly.  This retraction and assistance made it  possible to see the tissue plans for the procedure.  The assistance was needed for blood control, tissue re-approximation and assisted with closure of the incision site.

## 2019-12-06 NOTE — Transfer of Care (Signed)
Immediate Anesthesia Transfer of Care Note  Patient: Caroline Turner  Procedure(s) Performed: BREAST REDUCTION WITH LIPOSUCTION (Bilateral Breast)  Patient Location: PACU  Anesthesia Type:General  Level of Consciousness: awake, alert , drowsy and patient cooperative  Airway & Oxygen Therapy: Patient Spontanous Breathing and Patient connected to face mask oxygen  Post-op Assessment: Report given to RN and Post -op Vital signs reviewed and stable  Post vital signs: Reviewed and stable  Last Vitals:  Vitals Value Taken Time  BP 128/73 12/06/19 1117  Temp    Pulse 91 12/06/19 1120  Resp 18 12/06/19 1120  SpO2 97 % 12/06/19 1120  Vitals shown include unvalidated device data.  Last Pain:  Vitals:   12/06/19 0710  TempSrc: Oral  PainSc: 0-No pain         Complications: No complications documented.

## 2019-12-06 NOTE — Anesthesia Postprocedure Evaluation (Signed)
Anesthesia Post Note  Patient: Caroline Turner  Procedure(s) Performed: BREAST REDUCTION WITH LIPOSUCTION (Bilateral Breast)     Patient location during evaluation: PACU Anesthesia Type: General Level of consciousness: awake and alert Pain management: pain level controlled Vital Signs Assessment: post-procedure vital signs reviewed and stable Respiratory status: spontaneous breathing, nonlabored ventilation and respiratory function stable Cardiovascular status: blood pressure returned to baseline and stable Postop Assessment: no apparent nausea or vomiting Anesthetic complications: no   No complications documented.  Last Vitals:  Vitals:   12/06/19 1200 12/06/19 1215  BP: 114/66 119/68  Pulse: 73 82  Resp: 14 16  Temp:    SpO2: 98% 91%    Last Pain:  Vitals:   12/06/19 1215  TempSrc:   PainSc: 8                  Lowella Curb

## 2019-12-06 NOTE — Progress Notes (Signed)
Post op meds sent to pharmacy for patient.

## 2019-12-06 NOTE — Discharge Instructions (Signed)
INSTRUCTIONS FOR AFTER SURGERY   You will likely have some questions about what to expect following your operation.  The following information will help you and your family understand what to expect when you are discharged from the hospital.  Following these guidelines will help ensure a smooth recovery and reduce risks of complications.  Postoperative instructions include information on: diet, wound care, medications and physical activity.  AFTER SURGERY Expect to go home after the procedure.  In some cases, you may need to spend one night in the hospital for observation.  DIET This surgery does not require a specific diet.  However, I have to mention that the healthier you eat the better your body can start healing. It is important to increasing your protein intake.  This means limiting the foods with added sugar.  Focus on fruits and vegetables and some meat.  If you have any liposuction during your procedure be sure to drink water.  If your urine is bright yellow, then it is concentrated, and you need to drink more water.  As a general rule after surgery, you should have 8 ounces of water every hour while awake.  If you find you are persistently nauseated or unable to take in liquids let us know.  NO TOBACCO USE or EXPOSURE.  This will slow your healing process and increase the risk of a wound.  WOUND CARE Apply the nitro paste to the nipples every 8 hrs for 2 days. If you have a drain: Clean with baby wipes until the drain is removed.   If you have steri-strips / tape directly attached to your skin leave them in place. It is OK to get these wet.  No baths, pools or hot tubs for two weeks. We close your incision to leave the smallest and best-looking scar. No ointment or creams on your incisions until given the go ahead.  Especially not Neosporin (Too many skin reactions with this one).  A few weeks after surgery you can use Mederma and start massaging the scar. We ask you to wear your binder or  sports bra for the first 6 weeks around the clock, including while sleeping. This provides added comfort and helps reduce the fluid accumulation at the surgery site.  ACTIVITY No heavy lifting until cleared by the doctor.  It is OK to walk and climb stairs. In fact, moving your legs is very important to decrease your risk of a blood clot.  It will also help keep you from getting deconditioned.  Every 1 to 2 hours get up and walk for 5 minutes. This will help with a quicker recovery back to normal.  Let pain be your guide so you don't do too much.  NO, you cannot do the spring cleaning and don't plan on taking care of anyone else.  This is your time for TLC.   WORK Everyone returns to work at different times. As a rough guide, most people take at least 1 - 2 weeks off prior to returning to work. If you need documentation for your job, bring the forms to your postoperative follow up visit.  DRIVING Arrange for someone to bring you home from the hospital.  You may be able to drive a few days after surgery but not while taking any narcotics or valium.  BOWEL MOVEMENTS Constipation can occur after anesthesia and while taking pain medication.  It is important to stay ahead for your comfort.  We recommend taking Milk of Magnesia (2 tablespoons; twice a day)  while taking the pain pills.  SEROMA This is fluid your body tried to put in the surgical site.  This is normal but if it creates excessive pain and swelling let us know.  It usually decreases in a few weeks.  MEDICATIONS and PAIN CONTROL At your preoperative visit for you history and physical you were given the following medications: 1. An antibiotic: Start this medication when you get home and take according to the instructions on the bottle. 2. Zofran 4 mg:  This is to treat nausea and vomiting.  You can take this every 6 hours as needed and only if needed. 3. Norco (hydrocodone/acetaminophen) 5/325 mg:  This is only to be used after you have  taken the motrin or the tylenol. Every 8 hours as needed. Over the counter Medication to take: 4. Ibuprofen (Motrin) 600 mg:  Take this every 6 hours.  If you have additional pain then take 500 mg of the tylenol.  Only take the Norco after you have tried these two. 5. Miralax or stool softener of choice: Take this according to the bottle if you take the Norco.  WHEN TO CALL Call your surgeon's office if any of the following occur: . Fever 101 degrees F or greater . Excessive bleeding or fluid from the incision site. . Pain that increases over time without aid from the medications . Redness, warmth, or pus draining from incision sites . Persistent nausea or inability to take in liquids . Severe misshapen area that underwent the operation.    Post Anesthesia Home Care Instructions  Activity: Get plenty of rest for the remainder of the day. A responsible individual must stay with you for 24 hours following the procedure.  For the next 24 hours, DO NOT: -Drive a car -Advertising copywriter -Drink alcoholic beverages -Take any medication unless instructed by your physician -Make any legal decisions or sign important papers.  Meals: Start with liquid foods such as gelatin or soup. Progress to regular foods as tolerated. Avoid greasy, spicy, heavy foods. If nausea and/or vomiting occur, drink only clear liquids until the nausea and/or vomiting subsides. Call your physician if vomiting continues.  Special Instructions/Symptoms: Your throat may feel dry or sore from the anesthesia or the breathing tube placed in your throat during surgery. If this causes discomfort, gargle with warm salt water. The discomfort should disappear within 24 hours.  About my Jackson-Pratt Bulb Drain  What is a Jackson-Pratt bulb? A Jackson-Pratt is a soft, round device used to collect drainage. It is connected to a long, thin drainage catheter, which is held in place by one or two small stiches near your surgical  incision site. When the bulb is squeezed, it forms a vacuum, forcing the drainage to empty into the bulb.  Emptying the Jackson-Pratt bulb- To empty the bulb: 1. Release the plug on the top of the bulb. 2. Pour the bulb's contents into a measuring container which your nurse will provide. 3. Record the time emptied and amount of drainage. Empty the drain(s) as often as your     doctor or nurse recommends.  Date                  Time                    Amount (Drain 1)                 Amount (Drain 2)  _____________________________________________________________________  _____________________________________________________________________  _____________________________________________________________________  _____________________________________________________________________  _____________________________________________________________________  _____________________________________________________________________  _____________________________________________________________________  _____________________________________________________________________  Squeezing the Jackson-Pratt Bulb- To squeeze the bulb: 1. Make sure the plug at the top of the bulb is open. 2. Squeeze the bulb tightly in your fist. You will hear air squeezing from the bulb. 3. Replace the plug while the bulb is squeezed. 4. Use a safety pin to attach the bulb to your clothing. This will keep the catheter from     pulling at the bulb insertion site.  When to call your doctor- Call your doctor if:  Drain site becomes red, swollen or hot.  You have a fever greater than 101 degrees F.  There is oozing at the drain site.  Drain falls out (apply a guaze bandage over the drain hole and secure it with tape).  Drainage increases daily not related to activity patterns. (You will usually have more drainage when you are active than when you are resting.)  Drainage has a bad odor.

## 2019-12-06 NOTE — Progress Notes (Signed)
Patient requested post op meds sent to different pharmacy

## 2019-12-06 NOTE — Anesthesia Procedure Notes (Signed)

## 2019-12-07 ENCOUNTER — Encounter (HOSPITAL_BASED_OUTPATIENT_CLINIC_OR_DEPARTMENT_OTHER): Payer: Self-pay | Admitting: Plastic Surgery

## 2019-12-07 ENCOUNTER — Ambulatory Visit (INDEPENDENT_AMBULATORY_CARE_PROVIDER_SITE_OTHER): Payer: No Typology Code available for payment source | Admitting: Plastic Surgery

## 2019-12-07 DIAGNOSIS — Z9889 Other specified postprocedural states: Secondary | ICD-10-CM | POA: Insufficient documentation

## 2019-12-07 LAB — SURGICAL PATHOLOGY

## 2019-12-07 NOTE — Progress Notes (Signed)
Subjective:     Patient ID: Caroline Turner, female    DOB: [birth year], 49 y.o.   MRN: [id number]  Chief Complaint  Patient presents with  . Follow-up    HPI: The patient is a 49 y.o. female here for follow-up after undergoing bilateral breast reduction yesterday with Dr. Ulice Bold.  She presents today with husband's concern about "black" areas around NAC and that nipples are "dented in".  All incisions are intact, C/D.  No signs of infection, redness, drainage.  Mild bilateral diffuse swelling present.  Bilateral drains in place.  Some mild bruising and discoloration of the left breast around the NAC.  Some mild bruising on the right breast.  Nitropaste and papers are in place over bilateral NAC's.  Bilateral NAC's have good cap refill, less than 2 seconds.  Patient's blood pressure was 130/79 at visit.  Reported she had had some mild headaches.   Review of Systems  Constitutional: Negative for chills and fever.  Respiratory: Negative for shortness of breath.   Cardiovascular: Negative for chest pain and leg swelling.  Gastrointestinal: Negative for constipation, diarrhea, nausea and vomiting.  Skin: Positive for color change. Negative for pallor and rash.  Neurological: Positive for headaches.     Objective:   Vital Signs BP 130/79 (BP Location: Left Wrist, Patient Position: Sitting, Cuff Size: Normal)   Pulse 70   Temp 97.8 F (36.6 C) (Core)   SpO2 97%  Vital Signs and Nursing Note Reviewed  Physical Exam Constitutional:      General: She is not in acute distress.    Appearance: Normal appearance. She is not ill-appearing.  HENT:     Head: Normocephalic and atraumatic.  Eyes:     Extraocular Movements: Extraocular movements intact.  Cardiovascular:     Rate and Rhythm: Normal rate.  Pulmonary:     Effort: Pulmonary effort is normal.  Chest:     Comments: Bilateral breast incision are intact, clean and dry.  No signs of infection.  Some mild bruising and skin color  changes on left breast around NAC.  Mild bruising on right breast.  Bilateral drains in place with serosanguineous fluid in the bulb.  Nitropaste and papers in place on it bilateral NAC's.  Bilateral NAC's have good cap refill, less than 2 seconds. Musculoskeletal:        General: Normal range of motion.     Cervical back: Normal range of motion.  Skin:    General: Skin is warm and dry.     Coloration: Skin is not pale.     Findings: No rash.  Neurological:     Mental Status: She is alert and oriented to person, place, and time.     Gait: Gait is intact.  Psychiatric:        Mood and Affect: Mood and affect normal.        Behavior: Behavior normal.        Thought Content: Thought content normal.        Cognition and Memory: Memory normal.        Judgment: Judgment normal.       Assessment/Plan:     ICD-10-CM   1. S/P bilateral breast reduction  Z98.890     Patient is doing well 1 day postop.  The black is the dermabond that has mixed with a bit of dried blood.  Current "dent of nipples" is due to tightness of incisions and will improve as she heals.  Good cap refill to  bilateral NAC's, less than 2 seconds.  We will continue to watch bruising and slight discoloration of left breast around NAC.  Last application of the Nitropaste is tonight.  She can begin showering on Monday avoiding direct water spray.  Continue to wear binder or sports bra 24/7.  Continue to avoid heavy lifting.  Follow-up currently scheduled for August 24.  Call office with any questions/concerns.  Eldridge Abrahams, PA-C 12/07/2019, 2:24 PM

## 2019-12-11 ENCOUNTER — Encounter: Payer: Self-pay | Admitting: Plastic Surgery

## 2019-12-11 ENCOUNTER — Other Ambulatory Visit: Payer: Self-pay

## 2019-12-11 ENCOUNTER — Ambulatory Visit (INDEPENDENT_AMBULATORY_CARE_PROVIDER_SITE_OTHER): Payer: No Typology Code available for payment source | Admitting: Plastic Surgery

## 2019-12-11 VITALS — BP 136/70 | HR 80 | Temp 98.0°F

## 2019-12-11 DIAGNOSIS — Z9889 Other specified postprocedural states: Secondary | ICD-10-CM

## 2019-12-11 NOTE — Progress Notes (Signed)
Patient is a 49 yr-old female here for follow-up after undergoing bilateral breast reduction on 12/06/19.  She presents today requesting that her drains be removed as she has had less than 20 cc of daily output for the last several days.  For the last 2 to 3 days it has been less than 5 cc/day.  Drains removed today.  Honeycomb dressings removed today.  Incisions are healing very nicely, C/D/I.  No signs of infection, redness, drainage, seroma/hematoma.  Bilateral NAC's have good cap refill and appear viable.  Patient denies fever, chills, chest pain, shortness of breath, nausea/vomiting.  Apply Vaseline and gauze or bandage over drain sites for the next few days until closed.  Continue to wear compression garment 24/7 until 6 weeks postop.  Follow-up next week.  Call office with any questions/concerns.  The 21st Century Cures Act was signed into law in 2016 which includes the topic of electronic health records.  This provides immediate access to information in MyChart.  This includes consultation notes, operative notes, office notes, lab results and pathology reports.  If you have any questions about what you read please let us know at your next visit or call us at the office.  We are right here with you.

## 2019-12-14 ENCOUNTER — Encounter: Payer: No Typology Code available for payment source | Admitting: Plastic Surgery

## 2019-12-17 NOTE — Progress Notes (Signed)
Patient is a 49 year old female here for follow-up after undergoing bilateral breast reduction on 12/06/2019.  ~ 2 weeks PO Patient reports she is doing well today.  Most of the steristrips were removed today. Small superficial wound present on intersection of IMF and vertical limb on right breast.  All other bilateral incisions are healing nicely, c/d/i. No signs of infection, redness, drainage, seroma/hematoma. Denies F/C. N/V.  Bilateral breasts are soft and patient is pleased with their shape.  Apply vaseline to small wound daily and cover with gauze. Continue to wear compression garment 24/7 until 6 weeks PO. Follow up scheduled for 12/28/19. Call office with any questions/concerns.   The 21st Century Cures Act was signed into law in 2016 which includes the topic of electronic health records.  This provides immediate access to information in MyChart.  This includes consultation notes, operative notes, office notes, lab results and pathology reports.  If you have any questions about what you read please let us know at your next visit or call us at the office.  We are right here with you.

## 2019-12-18 ENCOUNTER — Ambulatory Visit (INDEPENDENT_AMBULATORY_CARE_PROVIDER_SITE_OTHER): Payer: No Typology Code available for payment source | Admitting: Plastic Surgery

## 2019-12-18 ENCOUNTER — Telehealth: Payer: Self-pay

## 2019-12-18 ENCOUNTER — Other Ambulatory Visit: Payer: Self-pay

## 2019-12-18 ENCOUNTER — Encounter: Payer: Self-pay | Admitting: Plastic Surgery

## 2019-12-18 VITALS — BP 144/77 | HR 71

## 2019-12-18 DIAGNOSIS — Z9889 Other specified postprocedural states: Secondary | ICD-10-CM

## 2019-12-24 ENCOUNTER — Telehealth: Payer: Self-pay

## 2019-12-24 ENCOUNTER — Encounter: Payer: Self-pay | Admitting: Family Medicine

## 2019-12-24 DIAGNOSIS — I10 Essential (primary) hypertension: Secondary | ICD-10-CM

## 2019-12-24 NOTE — Telephone Encounter (Signed)
is there a chance you can refill my hydroclorithide to [facility] thank you

## 2019-12-26 NOTE — Progress Notes (Signed)
Patient is a 49 year old female here for follow-up after undergoing bilateral breast reduction on 12/06/2019 with Dr. Ulice Bold.  At last visit on 8/24 she had a small superficial wound present on the intersection of the IMF and vertical limb of the right breast.  ~ 3 weeks PO Patient reports she is doing very well overall.  Superficial wound at the intersection of the IMF and vertical limb on her right breast has increased a little in size.  There are no signs of infection.  All other incisions are healing very nicely, C/D/I.  No signs of infection, redness, drainage, seroma/hematoma.  Patient denies fever/chills, nausea/vomiting.  A few suture knots were removed today.  Continue to apply Vaseline and gauze to wound as well as rest of incisions if desired.  Continue to wear compression garment 24/7 until 6 weeks postop.  Continue to avoid heavy lifting for at least 1 more week and then may slowly increase as tolerated.  Follow-up in 2 to 3 weeks.  Call office with any questions/concerns.  The 21st Century Cures Act was signed into law in 2016 which includes the topic of electronic health records.  This provides immediate access to information in MyChart.  This includes consultation notes, operative notes, office notes, lab results and pathology reports.  If you have any questions about what you read please let us know at your next visit or call us at the office.  We are right here with you.

## 2019-12-28 ENCOUNTER — Other Ambulatory Visit: Payer: Self-pay

## 2019-12-28 ENCOUNTER — Ambulatory Visit (INDEPENDENT_AMBULATORY_CARE_PROVIDER_SITE_OTHER): Payer: No Typology Code available for payment source | Admitting: Plastic Surgery

## 2019-12-28 ENCOUNTER — Encounter: Payer: Self-pay | Admitting: Plastic Surgery

## 2019-12-28 VITALS — BP 109/72 | HR 64 | Temp 98.3°F

## 2019-12-28 DIAGNOSIS — Z9889 Other specified postprocedural states: Secondary | ICD-10-CM

## 2019-12-31 ENCOUNTER — Ambulatory Visit: Payer: Self-pay | Admitting: Family Medicine

## 2020-01-02 ENCOUNTER — Encounter: Payer: Self-pay | Admitting: Family Medicine

## 2020-01-03 ENCOUNTER — Ambulatory Visit: Payer: Self-pay | Admitting: Licensed Clinical Social Worker

## 2020-01-03 ENCOUNTER — Other Ambulatory Visit: Payer: Self-pay | Admitting: Family Medicine

## 2020-01-03 DIAGNOSIS — I10 Essential (primary) hypertension: Secondary | ICD-10-CM

## 2020-01-03 MED ORDER — HYDROCHLOROTHIAZIDE 25 MG PO TABS: ORAL_TABLET | ORAL | 0 refills | Status: DC

## 2020-01-03 NOTE — Chronic Care Management (AMB) (Signed)
Chronic Care Management    Clinical Social Work Follow Up Note  01/03/2020 Name: Caroline Turner MRN: [id number] DOB: 1971-01-09  Caroline Turner is a 49 y.o. year old female who is a primary care patient of Anitra Lauth, Jodelle Gross, FNP. The CCM team was consulted for assistance with Mental Health Counseling and Resources.   Review of patient status, including review of consultants reports, other relevant assessments, and collaboration with appropriate care team members and the patient's provider was performed as part of comprehensive patient evaluation and provision of chronic care management services.    SDOH (Social Determinants of Health) assessments performed: Yes    Outpatient Encounter Medications as of 01/03/2020  Medication Sig  . acetaminophen (TYLENOL) 500 MG tablet Take 1 tablet (500 mg total) by mouth every 6 (six) hours as needed. For use AFTER surgery  . desoximetasone (TOPICORT) 0.25 % cream Apply 1 application topically 2 (two) times daily.  . Ergocalciferol (VITAMIN D2) 50 MCG (2000 UT) TABS Take by mouth.   . hydrochlorothiazide (HYDRODIURIL) 25 MG tablet TAKE 1 TABLET(25 MG) BY MOUTH DAILY  . ibuprofen (ADVIL) 600 MG tablet Take 1 tablet (600 mg total) by mouth every 6 (six) hours as needed for mild pain or moderate pain. For use AFTER surgery  . polyethylene glycol powder (GLYCOLAX/MIRALAX) 17 GM/SCOOP powder Take 17 g by mouth 2 (two) times daily as needed.  . vitamin B-12 (CYANOCOBALAMIN) 100 MCG tablet Take 100 mcg by mouth daily.  . [DISCONTINUED] hydrochlorothiazide (HYDRODIURIL) 25 MG tablet TAKE 1 TABLET(25 MG) BY MOUTH DAILY   No facility-administered encounter medications on file as of 01/03/2020.     Goals Addressed    .  SW: I want more resource education (pt-stated)        CARE PLAN ENTRY (see longtitudinal plan of care for additional care plan information)  Current Barriers:  . Limited social support . ADL IADL limitations . Family and relationship  dysfunction . Social Isolation . Limited education about POA information completion*  Clinical Social Work Clinical Goal(s):  Marland Kitchen Over the next 120 days, patient will demonstrate improved health management independence as evidenced by gaining appropriate education/resources in order to improve overall quality of life and health. . Over the next 120 days, patient will demonstrate improved health management independence as evidenced by implementing healthy self-care and positive support/resources into her daily routine to cope with stressors and improve mental and physical health.  Interventions: . Patient interviewed and appropriate assessments performed . Provided patient with information about how to complete POA documentation. Family understands that patient will need to not only be agreeable to [facility] but also have the capacity to agree to this. Patient shares that she will start this conversation with family soon. Family was also encouraged to look into getting a lawyer for this process. LCSW sent email to patient with resources, education, POA documentation and a list of local lawyers in the area of her mother ([place], [place]) Patient reports that her mother is coming to visit her soon and she will start this open dialogue/conversation. Patient is hopeful that her mother will be agreeable. Patient confirmed receiving resources in regards to this matter. UPDATE- Patient reports that they are no longer seeking a higher level of care for her mother as they have enrolled in a program that has been beneficial to family. This program allows patient's sister to be a paid aide for their mother. Mother feels more comfortable with having family provide her with caregiver instead of  people that she does not know.  . Discussed plans with patient for ongoing care management follow up and provided patient with direct contact information for care management team . Advised patient to contact CCM if she has any  concerns that arise during the POA completion process. . Assisted patient/caregiver with obtaining information about health plan benefits . Provided education and assistance to client regarding Advanced Directives. . Patient reports ongoing martial conflict and is interested in where she can receive couples counseling. LCSW sent patient a secure email with available resources and highlighted the ones that offer couples therapy. However, patient reports that she has decided to pursue individual counseling at this time instead through her employer. Patient is hopeful that spouse will consider individual counseling therapy as well. Patient appreciative of resource education that was provided.  Marland Kitchen LCSW discussed coping skills for family dysfunction and stress. SW used empathetic and active and reflective listening, validated patient's feelings/concerns, and provided emotional support. LCSW provided self-care education to help manage her multiple health conditions and improve her mood.  . Patient contacted [facility] for medication refill. Patient has not been seen in the office in some time and was encouraged to schedule PCP visit.   Patient Self Care Activities:  . Attends all scheduled provider appointments . Calls provider office for new concerns or questions . Lacks social connections  Please see past updates related to this goal by clicking on the "Past Updates" button in the selected goal       Follow Up Plan: SW will follow up with patient by phone over the next quarter  Dickie La, [place], MSW, LCSW [facility]  [facility] [email or web address] Phone: [phone]

## 2020-01-24 NOTE — Progress Notes (Deleted)
Patient is a 49 year old female here for follow-up after undergoing bilateral breast reduction on 12/06/2019 with Dr. Ulice Bold.  At last visit on 9/3, superficial wound at the intersection of the IMF and vertical line on her right breast had increased in size slightly.  No signs of infection.  Daily dressing changes of Vaseline and gauze.  ~ 7 weeks PO

## 2020-01-25 ENCOUNTER — Ambulatory Visit: Payer: No Typology Code available for payment source | Admitting: Plastic Surgery

## 2020-01-27 NOTE — Progress Notes (Deleted)
Preoperative Dx: ***  Postoperative Dx:  same  Procedure: laser to ***   Anesthesia: none  Description of Procedure:  Risks and complications were explained to the patient. Consent was confirmed and signed. Time out was called and all information was confirmed to be correct. The area  area was prepped with alcohol and wiped dry. The *** laser was set at *** J/cm2. The *** was lasered. The patient tolerated the procedure well and there were no complications. The patient is to follow up in 4 weeks.

## 2020-01-30 ENCOUNTER — Other Ambulatory Visit: Payer: No Typology Code available for payment source | Admitting: Plastic Surgery

## 2020-03-12 NOTE — Progress Notes (Signed)
Patient is a 49 year old female here for follow-up after undergoing bilateral breast reduction on 12/06/2019 with Dr. Ulice Bold.  At her last visit on 12/28/2019 she had a superficial wound at the intersection of the IMF and vertical limb that had increased in size since her previous visit.  ~11 weeks PO Patient reports overall she is doing well.  She presents today with concerns for areas of hardness on the lateral aspect of both breasts.  She denies fever/chills, nausea/vomiting.  Reports she has been doing some circular massage on these areas and does not feel they have significantly improved.  She reports some tenderness of these areas. Suspect fat necrosis/scar tissue. Bilateral incisions have healed very nicely, C/D/I.  No signs of infection, redness, drainage.  No signs of swelling. Recommend continuing with circular massage.  Follow-up with Dr. Ulice Bold to discuss further and explore any options.  Call office with any questions/concerns.  Pictures were obtained of the patient and placed in the chart with the patient's or guardian's permission.

## 2020-03-13 ENCOUNTER — Encounter: Payer: Self-pay | Admitting: Plastic Surgery

## 2020-03-13 ENCOUNTER — Ambulatory Visit (INDEPENDENT_AMBULATORY_CARE_PROVIDER_SITE_OTHER): Payer: No Typology Code available for payment source | Admitting: Plastic Surgery

## 2020-03-13 ENCOUNTER — Other Ambulatory Visit: Payer: Self-pay

## 2020-03-13 VITALS — BP 124/78 | HR 76 | Temp 98.6°F

## 2020-03-13 DIAGNOSIS — Z9889 Other specified postprocedural states: Secondary | ICD-10-CM

## 2020-03-25 ENCOUNTER — Ambulatory Visit: Payer: Self-pay | Admitting: Licensed Clinical Social Worker

## 2020-03-25 NOTE — Chronic Care Management (AMB) (Signed)
Care Management   Follow Up Note   03/25/2020 Name: Caroline Turner MRN: [id number] DOB: 1970-05-23  Caroline Turner is enrolled in a Managed Medicaid plan: Yes. Outreach attempt today was successful.    Referred by: Tarri Fuller, FNP Reason for referral : Care Coordination   Caroline Turner is a 49 y.o. year old female who is a primary care patient of Anitra Lauth, Jodelle Gross, FNP. The care management team was consulted for assistance with care management and care coordination needs.    Review of patient status, including review of consultants reports, relevant laboratory and other test results, and collaboration with appropriate care team members and the patient's provider was performed as part of comprehensive patient evaluation and provision of chronic care management services.    Goals Addressed    .  SW: I want more resource education (pt-stated)        CARE PLAN ENTRY (see longtitudinal plan of care for additional care plan information)  Current Barriers:  . Limited social support . ADL IADL limitations . Family and relationship dysfunction . Social Isolation . Limited education about POA information completion*  Clinical Social Work Clinical Goal(s):  Marland Kitchen Over the next 120 days, patient will demonstrate improved health management independence as evidenced by gaining appropriate education/resources in order to improve overall quality of life and health. . Over the next 120 days, patient will demonstrate improved health management independence as evidenced by implementing healthy self-care and positive support/resources into her daily routine to cope with stressors and improve mental and physical health.  Interventions: . Patient interviewed and appropriate assessments performed . Provided patient with information about how to complete POA documentation. Family understands that patient will need to not only be agreeable to [facility] but also have the capacity to agree to this. Patient  shares that she will start this conversation with family soon. Family was also encouraged to look into getting a lawyer for this process. LCSW sent email to patient with resources, education, POA documentation and a list of local lawyers in the area of her mother ([place], [place]) Patient reports that her mother is coming to visit her soon and she will start this open dialogue/conversation. Patient is hopeful that her mother will be agreeable. Patient confirmed receiving resources in regards to this matter. UPDATE- Patient reports that they are no longer seeking a higher level of care for her mother as they have enrolled in a program that has been beneficial to family. This program allows patient's sister to be a paid aide for their mother. Mother feels more comfortable with having family provide her with caregiver instead of people that she does not know. Patient's sister is with mother 24/7. Patient has been sending family money to help out with her mother.  . Discussed plans with patient for ongoing care management follow up and provided patient with direct contact information for care management team . Advised patient to contact CCM if she has any concerns that arise during the POA completion process. . Assisted patient/caregiver with obtaining information about health plan benefits . Provided education and assistance to client regarding Advanced Directives. . Patient reports ongoing martial conflict and is interested in where she can receive couples counseling. LCSW sent patient a secure email with available resources and highlighted the ones that offer couples therapy. However, patient reports that she has decided to pursue individual counseling at this time instead through her employer. Patient is hopeful that spouse will consider individual counseling therapy as well.  Patient appreciative of resource education that was provided.  Marland Kitchen LCSW discussed coping skills for family dysfunction and stress. SW  used empathetic and active and reflective listening, validated patient's feelings/concerns, and provided emotional support. LCSW provided self-care education to help manage her multiple health conditions and improve her mood.  . Patient contacted [facility] for medication refill. Patient has not been seen in the office in some time and was encouraged to schedule PCP visit again today on 03/25/20 . Patient works full time and is financially stable  . Patient was educated on healthy boundaries that she can put in place with her family and friends . Patient denies further social work needs at this time.   Patient Self Care Activities:  . Attends all scheduled provider appointments . Calls provider office for new concerns or questions . Lacks social connections  Please see past updates related to this goal by clicking on the "Past Updates" button in the selected goal      Dickie La, [place], MSW, LCSW [facility]  [facility] [email or web address] Phone: [phone]

## 2020-04-01 ENCOUNTER — Other Ambulatory Visit: Payer: Self-pay | Admitting: Family Medicine

## 2020-04-01 ENCOUNTER — Ambulatory Visit (INDEPENDENT_AMBULATORY_CARE_PROVIDER_SITE_OTHER): Payer: Self-pay | Admitting: Family Medicine

## 2020-04-01 ENCOUNTER — Ambulatory Visit (INDEPENDENT_AMBULATORY_CARE_PROVIDER_SITE_OTHER): Payer: No Typology Code available for payment source | Admitting: Plastic Surgery

## 2020-04-01 ENCOUNTER — Encounter: Payer: Self-pay | Admitting: Family Medicine

## 2020-04-01 ENCOUNTER — Other Ambulatory Visit: Payer: Self-pay

## 2020-04-01 ENCOUNTER — Encounter: Payer: Self-pay | Admitting: Plastic Surgery

## 2020-04-01 VITALS — BP 140/75 | HR 70 | Temp 98.6°F | Resp 17 | Ht 63.0 in | Wt 219.2 lb

## 2020-04-01 VITALS — BP 170/82 | HR 83 | Temp 99.0°F

## 2020-04-01 DIAGNOSIS — G8929 Other chronic pain: Secondary | ICD-10-CM

## 2020-04-01 DIAGNOSIS — Z0000 Encounter for general adult medical examination without abnormal findings: Secondary | ICD-10-CM

## 2020-04-01 DIAGNOSIS — R635 Abnormal weight gain: Secondary | ICD-10-CM

## 2020-04-01 DIAGNOSIS — R748 Abnormal levels of other serum enzymes: Secondary | ICD-10-CM

## 2020-04-01 DIAGNOSIS — M25512 Pain in left shoulder: Secondary | ICD-10-CM

## 2020-04-01 DIAGNOSIS — I10 Essential (primary) hypertension: Secondary | ICD-10-CM

## 2020-04-01 DIAGNOSIS — E781 Pure hyperglyceridemia: Secondary | ICD-10-CM

## 2020-04-01 DIAGNOSIS — E559 Vitamin D deficiency, unspecified: Secondary | ICD-10-CM

## 2020-04-01 DIAGNOSIS — Z9889 Other specified postprocedural states: Secondary | ICD-10-CM

## 2020-04-01 DIAGNOSIS — Z1211 Encounter for screening for malignant neoplasm of colon: Secondary | ICD-10-CM

## 2020-04-01 LAB — POCT URINALYSIS DIPSTICK
Bilirubin, UA: NEGATIVE
Blood, UA: NEGATIVE
Glucose, UA: NEGATIVE
Ketones, UA: NEGATIVE
Leukocytes, UA: NEGATIVE
Nitrite, UA: NEGATIVE
Protein, UA: NEGATIVE
Spec Grav, UA: <=1.005 — AB (ref 1.010–1.025)
Urobilinogen, UA: 0.2 E.U./dL
pH, UA: 8 (ref 5.0–8.0)

## 2020-04-01 MED ORDER — HYDROCHLOROTHIAZIDE 25 MG PO TABS: ORAL_TABLET | ORAL | 1 refills | Status: DC

## 2020-04-01 NOTE — Progress Notes (Signed)
   Subjective:    Patient ID: Caroline Turner, female    DOB: [birth year], 49 y.o.   MRN: [id number]  The patient is a 49 year old female here for follow-up on her bilateral breast reduction.  She had her surgery and August 2021.  She had over 1000 g removed from each breast.  She is doing really well overall and is pleased with her results.  She has a decrease in neck and back pain which is very exciting to her.  She has a little bit of tenderness and firmness on the lateral aspect of both breast at the inframammary fold.  This appears to be fat necrosis.  No sign of infection.  It is a little tender to palpation.  She has been doing some massage to the area    Review of Systems  Constitutional: Negative.   HENT: Negative.   Eyes: Negative.   Respiratory: Negative.   Cardiovascular: Negative.   Genitourinary: Negative.        Objective:   Physical Exam Vitals and nursing note reviewed.  Constitutional:      Appearance: Normal appearance.  Cardiovascular:     Rate and Rhythm: Normal rate.     Pulses: Normal pulses.  Pulmonary:     Effort: Pulmonary effort is normal.  Neurological:     General: No focal deficit present.     Mental Status: She is alert. Mental status is at baseline.  Psychiatric:        Mood and Affect: Mood normal.        Behavior: Behavior normal.         Assessment & Plan:     ICD-10-CM   1. S/P bilateral breast reduction  Z98.890     Continue massage and increase frequency and firmness to both breasts at the lateral aspect.  If it does not improve in 3 to 6 months we can certainly improve it in the OR by breaking up the fat necrosis.  The patient agrees to give it more time and do with the massage.  Pictures were obtained of the patient and placed in the chart with the patient's or guardian's permission.

## 2020-04-01 NOTE — Assessment & Plan Note (Signed)
Status unknown.  Recheck labs.  Followup after labs.  

## 2020-04-01 NOTE — Addendum Note (Signed)
Addended by: Lonna Cobb on: 04/01/2020 10:57 AM   Modules accepted: Orders

## 2020-04-01 NOTE — Patient Instructions (Addendum)
As we discussed, have your labs drawn in the next 1-2 weeks and we will contact you with the results.  A referral to Orthopedics has been placed today.  If you have not heard from the specialty office or our referral coordinator within 1 week, please let us know and we will follow up with the referral coordinator for an update.   Continue all medications as prescribed  Try to get exercise a minimum of 30 minutes per day at least 5 days per week as well as  adequate water intake all while measuring blood pressure a few times per week.  Keep a blood pressure log and bring back to clinic at your next visit.  If your readings are consistently over 130/80 to contact our office/send me a MyChart message and we will see you sooner.  Can try DASH and Mediterranean diet options, avoiding processed foods, lowering sodium intake, avoiding pork products, and eating a plant based diet for optimal health.  Well Visit: Care Instructions Overview  Well visits can help you stay healthy. Your provider has checked your overall health and may have suggested ways to take good care of yourself. Your provider also may have recommended tests. At home, you can help prevent illness with healthy eating, regular exercise, and other steps.  Follow-up care is a key part of your treatment and safety. Be sure to make and go to all appointments, and call your provider if you are having problems. It's also a good idea to know your test results and keep a list of the medicines you take.  How can you care for yourself at home?   Get screening tests that you and your doctor decide on. Screening helps find diseases before any symptoms appear.   Eat healthy foods. Choose fruits, vegetables, whole grains, protein, and low-fat dairy foods. Limit fat, especially saturated fat. Reduce salt in your diet.   Limit alcohol. If you are a man, have no more than 2 drinks a day or 14 drinks a week. If you are a woman, have no more than 1  drink a day or 7 drinks a week.   Get at least 30 minutes of physical activity on most days of the week.  We recommend you go no more than 2 days in a row without exercise. Walking is a good choice. You also may want to do other activities, such as running, swimming, cycling, or playing tennis or team sports. Discuss any changes in your exercise program with your provider.   Reach and stay at a healthy weight. This will lower your risk for many problems, such as obesity, diabetes, heart disease, and high blood pressure.   Do not smoke or allow others to smoke around you. If you need help quitting, talk to your provider about stop-smoking programs and medicines. These can increase your chances of quitting for good.  Can call 1-800-QUIT-NOW ((623)810-9958) for the [facility], assistance with smoking cessation.   Care for your mental health. It is easy to get weighed down by worry and stress. Learn strategies to manage stress, like deep breathing and mindfulness, and stay connected with your family and community. If you find you often feel sad or hopeless, talk with your provider. Treatment can help.   Talk to your provider about whether you have any risk factors for sexually transmitted infections (STIs). You can help prevent STIs if you wait to have sex with a new partner (or partners) until you've each been tested for STIs.  It also helps if you use condoms (female or female condoms) and if you limit your sex partners to one person who only has sex with you. Vaccines are available for some STIs, such as HPV (these are age dependent).   If you think you may have a problem with alcohol or drug use, talk to your provider. This includes prescription medicines (such as amphetamines and opioids) and illegal drugs (such as cocaine and methamphetamine). Your provider can help you figure out what type of treatment is best for you.   If you have concerns about domestic violence or intimate  partner violence, there are resources available to you. National Domestic Abuse Hotline (716)173-9209   Protect your skin from too much sun. When you're outdoors from 10 a.m. to 4 p.m., stay in the shade or cover up with clothing and a hat with a wide brim. Wear sunglasses that block UV rays. Even when it's cloudy, put broad-spectrum sunscreen (SPF 30 or higher) on any exposed skin.   See a dentist one or two times a year for checkups and to have your teeth cleaned.   See an eye doctor once per year for an eye exam.   Wear a seat belt in the car.  When should you call for help?  Watch closely for changes in your health, and be sure to contact your provider if you have any problems or symptoms that concern you.  We will plan to see you back in 6 months for hypertension follow up visit  You will receive a survey after today's visit either digitally by e-mail or paper by USPS mail. Your experiences and feedback matter to [place].  Please respond so we know how we are doing as we provide care for you.  Call us with any questions/concerns/needs.  It is my goal to be available to you for your health concerns.  Thanks for choosing me to be a partner in your healthcare needs!  Charlaine Dalton, FNP-C Family Nurse Practitioner [facility] Phone: [phone]

## 2020-04-01 NOTE — Assessment & Plan Note (Signed)
Annual physical exam without new findings.  Well adult with acute concerns for referral to Orthopedics for left shoulder chronic pain.  Plan: 1. Obtain health maintenance screenings as above according to age. - Increase physical activity to 30 minutes most days of the week.  - Eat healthy diet high in vegetables and fruits; low in refined carbohydrates. - Screening labs and tests as ordered 2. Return 1 year for annual physical.

## 2020-04-01 NOTE — Assessment & Plan Note (Signed)
Pt requiring colon cancer screening.  Denies family history of colon cancer.  Plan: - Discussed timing for initiation of colon cancer screening ACS vs USPSTF guidelines - Mutual decision making discussion for options of colonoscopy vs cologuard.  Pt prefers cologuard. - Ordered Cologuard today 

## 2020-04-01 NOTE — Progress Notes (Signed)
Subjective:    Patient ID: Caroline Turner, female    DOB: 07/04/1970, 49 y.o.   MRN: [id number]  Caroline Turner is a 49 y.o. female presenting on 04/01/2020 for Annual Exam (pt requesting a referral a ortho for chronic bilateral shoulder pain. Mostly in the left shoulder related to injury 3 yr ago and post shoulder surgery.)   HPI   HEALTH MAINTENANCE:  Weight/BMI: Obese, BMI 38.83% Physical activity: Stays active Diet: Regular Seatbelt: Always Sunscreen: Always PAP: Last 06/27/2018, due 06/26/2021 Mammogram: Last 09/14/2019, Next 09/13/2020 Colon cancer screening: Cologuard ordered HIV & Hep C Screening: Offered and declined  GC/CT: Offered and declined  Optometry: Yearly  Dentistry: Yearly   IMMUNIZATIONS: Tetanus: Last 06/27/2018 Influenza: Reports has had this year, unsure of date COVID: Reports has had Pfizer immunizations in January 2021, February 2021, and has received her [employer] booster.  Depression screen [facility] 2/9 07/03/2019 06/27/2018  Decreased Interest 0 0  Down, Depressed, Hopeless 0 0  PHQ - 2 Score 0 0  Altered sleeping - 1  Tired, decreased energy - 0  Change in appetite - 0  Feeling bad or failure about yourself  - 0  Trouble concentrating - 0  Moving slowly or fidgety/restless - 0  Suicidal thoughts - 0  PHQ-9 Score - 1  Difficult doing work/chores - Not difficult at all    Past Medical History:  Diagnosis Date  . History of swelling of feet   . Hyperlipidemia   . Hypertension   . Kidney stones   . PONV (postoperative nausea and vomiting)    Past Surgical History:  Procedure Laterality Date  . BREAST REDUCTION SURGERY Bilateral 12/06/2019   Procedure: BREAST REDUCTION WITH LIPOSUCTION;  Surgeon: Peggye Form, DO;  Location:  SURGERY CENTER;  Service: Plastics;  Laterality: Bilateral;  3.5 hours, please  . CHOLECYSTECTOMY    . CHOLECYSTECTOMY    . NEPHROLITHOTOMY    . PARTIAL HYSTERECTOMY    . SHOULDER SURGERY Left     Social History   Socioeconomic History  . Marital status: Married    Spouse name: Not on file  . Number of children: 3  . Years of education: Not on file  . Highest education level: Master's degree (e.g., MA, MS, MEng, MEd, MSW, MBA)  Occupational History  . Not on file  Tobacco Use  . Smoking status: Current Some Day Smoker    Packs/day: 0.10  . Smokeless tobacco: Never Used  . Tobacco comment: 5 cigarettes a week  Vaping Use  . Vaping Use: Never used  Substance and Sexual Activity  . Alcohol use: Yes    Alcohol/week: 1.0 standard drink    Types: 1 Cans of beer per week    Comment: occasionally  . Drug use: Never  . Sexual activity: Yes  Other Topics Concern  . Not on file  Social History Narrative  . Not on file   Social Determinants of Health   Financial Resource Strain:   . Difficulty of Paying Living Expenses: Not on file  Food Insecurity:   . Worried About Programme researcher, broadcasting/film/video in the Last Year: Not on file  . Ran Out of Food in the Last Year: Not on file  Transportation Needs:   . Lack of Transportation (Medical): Not on file  . Lack of Transportation (Non-Medical): Not on file  Physical Activity:   . Days of Exercise per Week: Not on file  . Minutes of Exercise per Session: Not on  file  Stress:   . Feeling of Stress : Not on file  Social Connections:   . Frequency of Communication with Friends and Family: Not on file  . Frequency of Social Gatherings with Friends and Family: Not on file  . Attends Religious Services: Not on file  . Active Member of Clubs or Organizations: Not on file  . Attends Banker Meetings: Not on file  . Marital Status: Not on file  Intimate Partner Violence:   . Fear of Current or Ex-Partner: Not on file  . Emotionally Abused: Not on file  . Physically Abused: Not on file  . Sexually Abused: Not on file   Family History  Problem Relation Age of Onset  . Cancer Father        lung, liver, mets to brain  .  Dementia Father   . Vascular Disease Mother   . Dementia Mother        vascular dementia  . Alzheimer's disease Mother    Current Outpatient Medications on File Prior to Visit  Medication Sig  . acetaminophen (TYLENOL) 500 MG tablet Take 1 tablet (500 mg total) by mouth every 6 (six) hours as needed. For use AFTER surgery  . Ergocalciferol (VITAMIN D2) 50 MCG (2000 UT) TABS Take by mouth.   Marland Kitchen ibuprofen (ADVIL) 600 MG tablet Take 1 tablet (600 mg total) by mouth every 6 (six) hours as needed for mild pain or moderate pain. For use AFTER surgery  . vitamin B-12 (CYANOCOBALAMIN) 100 MCG tablet Take 100 mcg by mouth daily.   No current facility-administered medications on file prior to visit.    Per HPI unless specifically indicated above     Objective:    BP 140/75 (BP Location: Right Arm, Patient Position: Sitting, Cuff Size: Large)   Pulse 70   Temp 98.6 F (37 C) (Oral)   Resp 17   Ht 5\' 3"  (1.6 m)   Wt 219 lb 3.2 oz (99.4 kg)   SpO2 100%   BMI 38.83 kg/m   Wt Readings from Last 3 Encounters:  04/01/20 219 lb 3.2 oz (99.4 kg)  12/06/19 220 lb 0.3 oz (99.8 kg)  11/09/19 218 lb 9.6 oz (99.2 kg)    Physical Exam Vitals and nursing note reviewed.  Constitutional:      General: She is not in acute distress.    Appearance: Normal appearance. She is well-developed and well-groomed. She is obese. She is not ill-appearing or toxic-appearing.  HENT:     Head: Normocephalic and atraumatic.     Right Ear: Tympanic membrane, ear canal and external ear normal. There is no impacted cerumen.     Left Ear: Tympanic membrane, ear canal and external ear normal. There is no impacted cerumen.     Nose: Nose normal. No congestion or rhinorrhea.     Comments: 11/11/19 is in place, covering mouth and nose.    Mouth/Throat:     Lips: Pink.     Mouth: Mucous membranes are moist.     Pharynx: Oropharynx is clear. Uvula midline. No oropharyngeal exudate or posterior oropharyngeal erythema.   Eyes:     General: Lids are normal. Vision grossly intact. No scleral icterus.       Right eye: No discharge.        Left eye: No discharge.     Extraocular Movements: Extraocular movements intact.     Conjunctiva/sclera: Conjunctivae normal.     Pupils: Pupils are equal, round, and reactive to light.  Neck:     Thyroid: No thyroid mass or thyromegaly.  Cardiovascular:     Rate and Rhythm: Normal rate and regular rhythm.     Pulses: Normal pulses.          Dorsalis pedis pulses are 2+ on the right side and 2+ on the left side.     Heart sounds: Normal heart sounds. No murmur heard.  No friction rub. No gallop.   Pulmonary:     Effort: Pulmonary effort is normal. No respiratory distress.     Breath sounds: Normal breath sounds.  Abdominal:     General: Abdomen is flat. Bowel sounds are normal. There is no distension.     Palpations: Abdomen is soft. There is no hepatomegaly, splenomegaly or mass.     Tenderness: There is no abdominal tenderness. There is no guarding or rebound.     Hernia: No hernia is present.  Musculoskeletal:        General: Normal range of motion.     Cervical back: Normal range of motion and neck supple. No tenderness.     Right lower leg: No edema.     Left lower leg: No edema.     Comments: Normal tone, strength 5/5 BUE & BLE  Left shoulder pain with certain movements, no loss of ROM  Feet:     Right foot:     Skin integrity: Skin integrity normal.     Left foot:     Skin integrity: Skin integrity normal.  Lymphadenopathy:     Cervical: No cervical adenopathy.  Skin:    General: Skin is warm and dry.     Capillary Refill: Capillary refill takes less than 2 seconds.  Neurological:     General: No focal deficit present.     Mental Status: She is alert and oriented to person, place, and time.     Cranial Nerves: No cranial nerve deficit.     Sensory: No sensory deficit.     Motor: No weakness.     Coordination: Coordination normal.     Gait: Gait  normal.     Deep Tendon Reflexes: Reflexes normal.  Psychiatric:        Attention and Perception: Attention and perception normal.        Mood and Affect: Mood and affect normal.        Speech: Speech normal.        Behavior: Behavior normal. Behavior is cooperative.        Thought Content: Thought content normal.        Cognition and Memory: Cognition and memory normal.        Judgment: Judgment normal.     Results for orders placed or performed during the hospital encounter of 12/06/19  Basic metabolic panel per protocol  Result Value Ref Range   Sodium 138 135 - 145 mmol/L   Potassium 3.6 3.5 - 5.1 mmol/L   Chloride 101 98 - 111 mmol/L   CO2 27 22 - 32 mmol/L   Glucose, Bld 96 70 - 99 mg/dL   BUN 14 6 - 20 mg/dL   Creatinine, Ser 6.96 0.44 - 1.00 mg/dL   Calcium 9.2 8.9 - 29.5 mg/dL   GFR calc non Af Amer >60 >60 mL/min   GFR calc Af Amer >60 >60 mL/min   Anion gap 10 5 - 15  Surgical pathology  Result Value Ref Range   SURGICAL PATHOLOGY      SURGICAL PATHOLOGY CASE: [phone] PATIENT: Nayara  Gruner Surgical Pathology Report     Clinical History: Mammary hypertrophy (nt)     FINAL MICROSCOPIC DIAGNOSIS:  A. BREAST, RIGHT, REDUCTION: - Fibrocystic changes with usual ductal hyperplasia. - No evidence of malignancy.  B. BREAST, LEFT, REDUCTION: - Fibrocystic changes with usual ductal hyperplasia. - No evidence of malignancy.  GROSS DESCRIPTION: Specimen A: Right breast tissue, in formalin @ 1145 Weight: Clinical weight is 1044 g, weight at gross is 1044 g Size in aggregate: 18 x 15 x 4 cm Skin: Pink-white unremarkable skin Cut Surface: Predominantly soft fatty tissue with a small amount of pink-white soft fibrous tissue.  A discrete lesion or mass is not identified. Block Summary: Representative sections in 2 blocks.  Specimen B: Left breast tissue, in formalin @ 1147 Weight: Clinical weight is 1174 g, weight at gross is 1173 g Size in aggregate: 19  x 14 x 4 cm Skin: There is pink-white u nremarkable skin Cut Surface: Predominantly soft fatty tissue with a small amount of pink-white soft fibrous tissue.  A discrete lesion or mass is not identified. Block Summary: Representative sections in 2 blocks. SW 12/06/2019  Final Diagnosis performed by Jimmy PicketJohn Patrick, MD.   Electronically signed 12/07/2019 Technical and / or Professional components performed at [facility], [address], [place], [place] [address].  Immunohistochemistry Technical component (if applicable) was performed at [facility]. [address], [address], [place], [place] [address].   IMMUNOHISTOCHEMISTRY DISCLAIMER (if applicable): Some of these immunohistochemical stains may have been developed and the performance characteristics determine by [facility]. Some may not have been cleared or approved by the U.S. Food and Drug Administration. The FDA has determined that such clearance or approval is not necessary. This test is used for clinical  purposes. It should not be regarded as investigational or for research. This laboratory is certified under the Clinical Laboratory Improvement Amendments of 1988 (CLIA-88) as qualified to perform high complexity clinical laboratory testing.  The controls stained appropriately.       Assessment & Plan:   Problem List Items Addressed This Visit      Cardiovascular and Mediastinum   Hypertension    Controlled hypertension.  BP is at goal.  Pt reports working on lifestyle modifications.  Taking medications tolerating well without side effects.  Complications:  Obesity, HLD, GERD  Plan: 1. Continue taking hydrochlorothiazide 25mg  daily 2. Obtain labs in the next 1-2 weeks  3. Encouraged heart healthy diet and increasing exercise to 30 minutes most days of the week, going no more than 2 days in a row without exercise. 4. Check BP 1-2 x per week at home, keep log, and bring to  clinic at next appointment. 5. Follow up 6 months.       Relevant Medications   hydrochlorothiazide (HYDRODIURIL) 25 MG tablet   Other Relevant Orders   CBC with Differential   COMPLETE METABOLIC PANEL WITH GFR     Other   Elevated liver enzymes    Status unknown.  Recheck labs.  Followup after labs.        Relevant Orders   COMPLETE METABOLIC PANEL WITH GFR   Hypertriglyceridemia    Status unknown.  Recheck labs.  Followup after labs.       Relevant Medications   hydrochlorothiazide (HYDRODIURIL) 25 MG tablet   Other Relevant Orders   Lipid Profile   Vitamin D deficiency    Status unknown.  Recheck labs.  Followup after labs.  Relevant Orders   Vitamin D (25 hydroxy)   Chronic left shoulder pain    Reports having had left shoulder labrum repair 2013 in [place], had a shelf fall into her left shoulder approximately 3 years ago and has been experiencing increased pain and discomfort with certain movements.  Is interested in establishing with a local orthopedics provider to discuss additional treatment options.  Referral to orthopedics placed.      Relevant Orders   AMB referral to orthopedics   Annual physical exam - Primary    Annual physical exam without new findings.  Well adult with acute concerns for referral to Orthopedics for left shoulder chronic pain.  Plan: 1. Obtain health maintenance screenings as above according to age. - Increase physical activity to 30 minutes most days of the week.  - Eat healthy diet high in vegetables and fruits; low in refined carbohydrates. - Screening labs and tests as ordered 2. Return 1 year for annual physical.       Colon cancer screening    Pt requiring colon cancer screening.  Denies family history of colon cancer.  Plan: - Discussed timing for initiation of colon cancer screening ACS vs USPSTF guidelines - Mutual decision making discussion for options of colonoscopy vs cologuard.  Pt prefers cologuard. -  Ordered Cologuard today        Relevant Orders   Cologuard    Other Visit Diagnoses    Weight gain       Relevant Orders   TSH + free T4   Essential hypertension       Relevant Medications   hydrochlorothiazide (HYDRODIURIL) 25 MG tablet      Meds ordered this encounter  Medications  . hydrochlorothiazide (HYDRODIURIL) 25 MG tablet    Sig: TAKE 1 TABLET(25 MG) BY MOUTH DAILY    Dispense:  90 tablet    Refill:  1    Follow up plan: Return in about 6 months (around 09/30/2020) for HTN F/U.  Charlaine Dalton, FNP-C Family Nurse Practitioner [facility] 04/01/2020, 10:10 AM

## 2020-04-01 NOTE — Assessment & Plan Note (Signed)
Reports having had left shoulder labrum repair 2013 in [place], had a shelf fall into her left shoulder approximately 3 years ago and has been experiencing increased pain and discomfort with certain movements.  Is interested in establishing with a local orthopedics provider to discuss additional treatment options.  Referral to orthopedics placed.

## 2020-04-01 NOTE — Assessment & Plan Note (Signed)
Controlled hypertension.  BP is at goal.  Pt reports working on lifestyle modifications.  Taking medications tolerating well without side effects.  Complications:  Obesity, HLD, GERD  Plan: 1. Continue taking hydrochlorothiazide 25mg  daily 2. Obtain labs in the next 1-2 weeks  3. Encouraged heart healthy diet and increasing exercise to 30 minutes most days of the week, going no more than 2 days in a row without exercise. 4. Check BP 1-2 x per week at home, keep log, and bring to clinic at next appointment. 5. Follow up 6 months.

## 2020-04-07 ENCOUNTER — Other Ambulatory Visit: Payer: Self-pay | Admitting: Orthopedic Surgery

## 2020-04-27 NOTE — Progress Notes (Deleted)
Preoperative Dx: Hyperpigmentation  Postoperative Dx:  same  Procedure: laser to face  Anesthesia: none  Description of Procedure:  Risks and complications were explained to the patient. Consent was confirmed and signed. Time out was called and all information was confirmed to be correct. The area  area was prepped with alcohol and wiped dry. The IPL laser was set at *** J/cm2. The face was lasered. The patient tolerated the procedure well and there were no complications. The patient is to follow up in 4 weeks.

## 2020-04-29 ENCOUNTER — Other Ambulatory Visit: Payer: No Typology Code available for payment source | Admitting: Plastic Surgery

## 2020-04-29 DIAGNOSIS — Z411 Encounter for cosmetic surgery: Secondary | ICD-10-CM

## 2020-05-07 ENCOUNTER — Other Ambulatory Visit: Payer: No Typology Code available for payment source

## 2020-05-07 DIAGNOSIS — Z20822 Contact with and (suspected) exposure to covid-19: Secondary | ICD-10-CM

## 2020-05-08 ENCOUNTER — Encounter: Payer: Self-pay | Admitting: Family Medicine

## 2020-05-08 ENCOUNTER — Other Ambulatory Visit: Payer: Self-pay

## 2020-05-08 ENCOUNTER — Telehealth (INDEPENDENT_AMBULATORY_CARE_PROVIDER_SITE_OTHER): Payer: Self-pay | Admitting: Family Medicine

## 2020-05-08 DIAGNOSIS — I10 Essential (primary) hypertension: Secondary | ICD-10-CM

## 2020-05-08 DIAGNOSIS — R0981 Nasal congestion: Secondary | ICD-10-CM | POA: Insufficient documentation

## 2020-05-08 LAB — SARS-COV-2, NAA 2 DAY TAT

## 2020-05-08 LAB — NOVEL CORONAVIRUS, NAA: SARS-CoV-2, NAA: DETECTED — AB

## 2020-05-08 MED ORDER — AZELASTINE HCL 0.1 % NA SOLN: 2.0000 | Freq: Two times a day (BID) | NASAL | 12 refills | Status: DC

## 2020-05-08 MED ORDER — GUAIFENESIN ER 600 MG PO TB12: 1200.0000 mg | ORAL_TABLET | Freq: Two times a day (BID) | ORAL | 0 refills | Status: AC

## 2020-05-08 NOTE — Telephone Encounter (Signed)
I scheduled the patient an appt today at 1:40pm to discuss her COVID like symptoms. Pending COVID test results.

## 2020-05-08 NOTE — Assessment & Plan Note (Signed)
COVID exposure with possible + COVID 19.  Testing pending.  Will treat with azelastine nasal spray and mucinex 1200mg  BID x 10 days.  Strict ER precautions discussed.  RTC PRN

## 2020-05-08 NOTE — Progress Notes (Signed)
Virtual Visit via Telephone  The purpose of this virtual visit is to provide medical care while limiting exposure to the novel coronavirus (COVID19) for both patient and office staff.  Consent was obtained for phone visit:  Yes.   Answered questions that patient had about telehealth interaction:  Yes.   I discussed the limitations, risks, security and privacy concerns of performing an evaluation and management service by telephone. I also discussed with the patient that there may be a patient responsible charge related to this service. The patient expressed understanding and agreed to proceed.  Patient is at home and is accessed via telephone Services are provided by Charlaine Dalton, FNP-C from [facility])  ---------------------------------------------------------------------- Chief Complaint  Patient presents with  . Covid Exposure    Stuffy nose, cough, sneezing, fatigue, weakness, and  head congestion. Pt started having symptoms x 4 days ago. She was tested on yesterday, but still waiting for her results.      S: Reviewed CMA documentation. I have called patient and gathered additional HPI as follows:  Virdia presents for virtual telemedicine visit via telephone for concerns of COVID exposure on Sunday morning.  Reports since Sunday night/Monday morning she started to have nasal congestion, headaches and chest congestion.  Has been taking mucinex and ibuprofen with minimal improvement in symptoms.  Has been tested for COVID yesterday without results.  Has had her COVID vaccines and booster.  Patient is currently home Denies any high risk travel to areas of current concern for COVID19. Denies any known or suspected exposure to person with or possibly with COVID19.  Past Medical History:  Diagnosis Date  . History of swelling of feet   . Hyperlipidemia   . Hypertension   . Kidney stones   . PONV (postoperative nausea and vomiting)    Social History    Tobacco Use  . Smoking status: Current Some Day Smoker    Packs/day: 0.10  . Smokeless tobacco: Never Used  . Tobacco comment: 5 cigarettes a week  Vaping Use  . Vaping Use: Never used  Substance Use Topics  . Alcohol use: Yes    Alcohol/week: 1.0 standard drink    Types: 1 Cans of beer per week    Comment: occasionally  . Drug use: Never    Current Outpatient Medications:  .  acetaminophen (TYLENOL) 500 MG tablet, Take 1 tablet (500 mg total) by mouth every 6 (six) hours as needed. For use AFTER surgery, Disp: 30 tablet, Rfl: 0 .  azelastine (ASTELIN) 0.1 % nasal spray, Place 2 sprays into both nostrils 2 (two) times daily. Use in each nostril as directed, Disp: 30 mL, Rfl: 12 .  dextromethorphan-guaiFENesin (MUCINEX DM) 30-600 MG 12hr tablet, Take 1 tablet by mouth 2 (two) times daily., Disp: , Rfl:  .  Ergocalciferol (VITAMIN D2) 50 MCG (2000 UT) TABS, Take by mouth. , Disp: , Rfl:  .  guaiFENesin (MUCINEX) 600 MG 12 hr tablet, Take 2 tablets (1,200 mg total) by mouth 2 (two) times daily for 10 days., Disp: 40 tablet, Rfl: 0 .  hydrochlorothiazide (HYDRODIURIL) 25 MG tablet, TAKE 1 TABLET(25 MG) BY MOUTH DAILY, Disp: 90 tablet, Rfl: 1 .  ibuprofen (ADVIL) 600 MG tablet, Take 1 tablet (600 mg total) by mouth every 6 (six) hours as needed for mild pain or moderate pain. For use AFTER surgery, Disp: 30 tablet, Rfl: 0 .  vitamin B-12 (CYANOCOBALAMIN) 100 MCG tablet, Take 100 mcg by mouth daily., Disp: , Rfl:  Depression screen [facility] 2/9 07/03/2019 06/27/2018  Decreased Interest 0 0  Down, Depressed, Hopeless 0 0  PHQ - 2 Score 0 0  Altered sleeping - 1  Tired, decreased energy - 0  Change in appetite - 0  Feeling bad or failure about yourself  - 0  Trouble concentrating - 0  Moving slowly or fidgety/restless - 0  Suicidal thoughts - 0  PHQ-9 Score - 1  Difficult doing work/chores - Not difficult at all    No flowsheet data  found.  -------------------------------------------------------------------------- O: No physical exam performed due to remote telephone encounter.  Physical Exam: Patient remotely monitored without video.  Verbal communication appropriate.  Cognition normal.  Recent Results (from the past 2160 hour(s))  POCT Urinalysis Dipstick     Status: Abnormal   Collection Time: 04/01/20 10:56 AM  Result Value Ref Range   Color, UA Yellow    Clarity, UA clear    Glucose, UA Negative Negative   Bilirubin, UA negative    Ketones, UA negative    Spec Grav, UA <=1.005 (A) 1.010 - 1.025   Blood, UA negative    pH, UA 8.0 5.0 - 8.0   Protein, UA Negative Negative   Urobilinogen, UA 0.2 0.2 or 1.0 E.U./dL   Nitrite, UA negative    Leukocytes, UA Negative Negative   Appearance     Odor      -------------------------------------------------------------------------- A&P:  Problem List Items Addressed This Visit      Other   Nasal congestion - Primary    COVID exposure with possible + COVID 19.  Testing pending.  Will treat with azelastine nasal spray and mucinex 1200mg  BID x 10 days.  Strict ER precautions discussed.  RTC PRN      Relevant Medications   guaiFENesin (MUCINEX) 600 MG 12 hr tablet   azelastine (ASTELIN) 0.1 % nasal spray      Meds ordered this encounter  Medications  . guaiFENesin (MUCINEX) 600 MG 12 hr tablet    Sig: Take 2 tablets (1,200 mg total) by mouth 2 (two) times daily for 10 days.    Dispense:  40 tablet    Refill:  0  . azelastine (ASTELIN) 0.1 % nasal spray    Sig: Place 2 sprays into both nostrils 2 (two) times daily. Use in each nostril as directed    Dispense:  30 mL    Refill:  12    Follow-up: - Return if symptoms worsen or fail to improve  Patient verbalizes understanding with the above medical recommendations including the limitation of remote medical advice.  Specific follow-up and call-back criteria were given for patient to follow-up or seek  medical care more urgently if needed.  - Time spent in direct consultation with patient on phone: 6 minutes  , FNP-C [facility] 05/08/2020, 1:55 PM

## 2020-07-09 ENCOUNTER — Ambulatory Visit: Payer: Self-pay | Attending: Internal Medicine

## 2020-07-09 DIAGNOSIS — Z20822 Contact with and (suspected) exposure to covid-19: Secondary | ICD-10-CM

## 2020-07-10 LAB — SARS-COV-2, NAA 2 DAY TAT

## 2020-07-10 LAB — NOVEL CORONAVIRUS, NAA: SARS-CoV-2, NAA: NOT DETECTED

## 2020-07-10 LAB — SPECIMEN STATUS REPORT

## 2020-08-19 ENCOUNTER — Other Ambulatory Visit: Payer: Self-pay

## 2020-08-19 MED ORDER — HYDROCHLOROTHIAZIDE 25 MG PO TABS
ORAL_TABLET | Freq: Every day | ORAL | 1 refills | Status: DC
  Filled 2020-08-19 – 2020-09-15 (×3): qty 90, 90d supply, fill #0
  Filled 2021-03-04 – 2021-04-13 (×2): qty 90, 90d supply, fill #1

## 2020-08-19 NOTE — Addendum Note (Signed)
Addended by: Lonna Cobb on: 08/19/2020 09:22 AM   Modules accepted: Orders

## 2020-08-26 ENCOUNTER — Encounter: Payer: Self-pay | Admitting: Internal Medicine

## 2020-09-02 ENCOUNTER — Other Ambulatory Visit: Payer: Self-pay

## 2020-09-15 ENCOUNTER — Other Ambulatory Visit: Payer: Self-pay

## 2020-09-30 ENCOUNTER — Ambulatory Visit (INDEPENDENT_AMBULATORY_CARE_PROVIDER_SITE_OTHER): Payer: No Typology Code available for payment source | Admitting: Plastic Surgery

## 2020-09-30 ENCOUNTER — Other Ambulatory Visit: Payer: Self-pay

## 2020-09-30 ENCOUNTER — Encounter: Payer: Self-pay | Admitting: Plastic Surgery

## 2020-09-30 DIAGNOSIS — Z9889 Other specified postprocedural states: Secondary | ICD-10-CM | POA: Diagnosis not present

## 2020-09-30 NOTE — Progress Notes (Signed)
   Subjective:    Patient ID: Caroline Turner, female    DOB: 10-Oct-1970, 50 y.o.   MRN: [id number]  The patient is a 50 year old female here for follow-up after breast reduction and about a year ago.  Overall she is doing really well and pleased with her results.  There is been a great improvement in her back pain.  The firmness of her breasts particularly in the lateral area has resolved.     Review of Systems  Constitutional: Negative.  Negative for activity change and appetite change.  HENT: Negative.   Eyes: Negative.   Respiratory: Negative.  Negative for chest tightness.   Cardiovascular: Negative.  Negative for leg swelling.  Gastrointestinal: Negative for abdominal distention.  Endocrine: Negative.   Genitourinary: Negative.   Musculoskeletal: Negative.   Hematological: Negative.   Psychiatric/Behavioral: Negative.        Objective:   Physical Exam Vitals and nursing note reviewed.  Constitutional:      Appearance: Normal appearance.  HENT:     Head: Normocephalic and atraumatic.  Cardiovascular:     Rate and Rhythm: Normal rate.     Pulses: Normal pulses.  Pulmonary:     Effort: Pulmonary effort is normal. No respiratory distress.  Abdominal:     General: Abdomen is flat. There is no distension.  Skin:    General: Skin is warm.     Capillary Refill: Capillary refill takes less than 2 seconds.     Coloration: Skin is not jaundiced.     Findings: No bruising.  Neurological:     Mental Status: She is alert. Mental status is at baseline.  Psychiatric:        Mood and Affect: Mood normal.        Behavior: Behavior normal.        Assessment & Plan:     ICD-10-CM   1. Status post breast reduction  Z98.890     Recommend continuing with the sports bra.  Massage as needed.  Overall I am very happy for her and recommend she continue with her activities to stay healthy.  Follow-up as needed.  Also recommend a mammogram by end of year.  Follow-up as  needed. Pictures were obtained of the patient and placed in the chart with the patient's or guardian's permission.

## 2020-10-06 ENCOUNTER — Other Ambulatory Visit: Payer: Self-pay | Admitting: Family Medicine

## 2020-10-06 DIAGNOSIS — Z1231 Encounter for screening mammogram for malignant neoplasm of breast: Secondary | ICD-10-CM

## 2020-10-08 ENCOUNTER — Other Ambulatory Visit: Payer: Self-pay

## 2020-10-08 ENCOUNTER — Ambulatory Visit
Admission: RE | Admit: 2020-10-08 | Discharge: 2020-10-08 | Disposition: A | Discharge status: Home / Self Care | Payer: No Typology Code available for payment source | Source: Ambulatory Visit

## 2020-10-08 DIAGNOSIS — Z1231 Encounter for screening mammogram for malignant neoplasm of breast: Secondary | ICD-10-CM

## 2020-11-26 ENCOUNTER — Other Ambulatory Visit: Payer: Self-pay

## 2020-11-26 ENCOUNTER — Encounter: Payer: Self-pay | Admitting: Family Medicine

## 2020-11-26 ENCOUNTER — Ambulatory Visit (INDEPENDENT_AMBULATORY_CARE_PROVIDER_SITE_OTHER): Payer: No Typology Code available for payment source | Admitting: Family Medicine

## 2020-11-26 ENCOUNTER — Telehealth: Payer: Self-pay

## 2020-11-26 VITALS — BP 130/73 | HR 76 | Ht 63.0 in | Wt 216.6 lb

## 2020-11-26 DIAGNOSIS — N200 Calculus of kidney: Secondary | ICD-10-CM

## 2020-11-26 DIAGNOSIS — R110 Nausea: Secondary | ICD-10-CM

## 2020-11-26 DIAGNOSIS — M545 Low back pain, unspecified: Secondary | ICD-10-CM

## 2020-11-26 DIAGNOSIS — R350 Frequency of micturition: Secondary | ICD-10-CM | POA: Diagnosis not present

## 2020-11-26 DIAGNOSIS — N3001 Acute cystitis with hematuria: Secondary | ICD-10-CM

## 2020-11-26 LAB — POCT URINALYSIS DIPSTICK
Bilirubin, UA: NEGATIVE
Glucose, UA: NEGATIVE
Ketones, UA: NEGATIVE
Nitrite, UA: NEGATIVE
Protein, UA: POSITIVE — AB
Spec Grav, UA: 1.02 (ref 1.010–1.025)
Urobilinogen, UA: 1 E.U./dL
pH, UA: 6 (ref 5.0–8.0)

## 2020-11-26 MED ORDER — CEPHALEXIN 500 MG PO CAPS
500.0000 mg | ORAL_CAPSULE | Freq: Three times a day (TID) | ORAL | 0 refills | Status: DC
  Filled 2020-11-26: qty 21, 7d supply, fill #0

## 2020-11-26 MED ORDER — IBUPROFEN 600 MG PO TABS
600.0000 mg | ORAL_TABLET | Freq: Three times a day (TID) | ORAL | 1 refills | Status: DC | PRN
  Filled 2020-11-26: qty 60, 20d supply, fill #0

## 2020-11-26 MED ORDER — TAMSULOSIN HCL 0.4 MG PO CAPS
0.4000 mg | ORAL_CAPSULE | Freq: Every day | ORAL | 0 refills | Status: DC
  Filled 2020-11-26: qty 30, 30d supply, fill #0

## 2020-11-26 MED ORDER — ONDANSETRON 4 MG PO TBDP
4.0000 mg | ORAL_TABLET | Freq: Three times a day (TID) | ORAL | 0 refills | Status: DC | PRN
  Filled 2020-11-26: qty 30, 10d supply, fill #0

## 2020-11-26 MED ORDER — CYCLOBENZAPRINE HCL 10 MG PO TABS
10.0000 mg | ORAL_TABLET | Freq: Two times a day (BID) | ORAL | 1 refills | Status: DC | PRN
  Filled 2020-11-26: qty 30, 15d supply, fill #0

## 2020-11-26 NOTE — Telephone Encounter (Signed)
Copied from CRM [id number]. Topic: General - Other >> Nov 26, 2020 11:54 AM Gaetana Michaelis A wrote: Reason for CRM: Patient would like to be prescribed a medication to help with urinary tract discomfort   The patient shares that they have been experiencing the discomfort since 11/22/20  Please contact further when possible  The pt was scheduled for an appt with Dr. Kirtland Bouchard at 3:40pm.

## 2020-11-26 NOTE — Patient Instructions (Addendum)
Thank you for coming to the office today.  Medicines ordered  Urology referral in  Start antibiotic  1. You have a Urinary Tract Infection - this is very common, your symptoms are reassuring and you should get better within 1 week on the antibiotics - Start Keflex 500mg  3 times daily for next 7 days, complete entire course, even if feeling better - We sent urine for a culture, we will call you within next few days if we need to change antibiotics - Please drink plenty of fluids, improve hydration over next 1 week  If symptoms worsening, developing nausea / vomiting, worsening back pain, fevers / chills / sweats, then please return for re-evaluation sooner.  If you take AZO OTC - limit this to 2-3 days MAX to avoid affecting kidneys  D-Mannose is a natural supplement that can actually help bind to urinary bacteria and reduce their effectiveness it can help prevent UTI from forming, and may reduce some symptoms. It likely cannot cure an active UTI but it is worth a try and good to prevent them with. Try 500mg  twice a day at a full dose if you want, or check package instructions for more info  You most likely have a Kidney Stone (Nephrolithiasis)  It can cause flank or side pain, abdominal pain, or pain with urination. Also blood in urine is very common.  You have been referred to Urology  Very important to see Urologist as scheduled and discuss removal options and they may repeat imaging  If you get any of the following it is more of an emergency and may need to go to hospital directly or we can try to contact Urology sooner  - Fever, chills sweats nausea vomiting cannot tolerate antibiotic - Cannot pee or void at all for 12 hours straight, despite straining and medicine - Worsening kidney function Creatinine - based on lab test today    Also start Flomax 0.4mg  daily for help improve urine flow   Please schedule a Follow-up Appointment to: Return if symptoms worsen or fail to  improve.  If you have any other questions or concerns, please feel free to call the office or send a message through MyChart. You may also schedule an earlier appointment if necessary.  Additionally, you may be receiving a survey about your experience at our office within a few days to 1 week by e-mail or mail. We value your feedback.  , DO [facility], Saralyn Pilar

## 2020-11-26 NOTE — Progress Notes (Signed)
Subjective:    Patient ID: Caroline Turner, female    DOB: 02/24/1971, 50 y.o.   MRN: [id number]  Caroline Turner is a 50 y.o. female presenting on 11/26/2020 for No chief complaint on file.  Previous PCP Danielle Rankin, FNP , has not re-established yet.  Patient presents for a same day appointment.  HPI  Urinary Frequency Left back pain, acute History kidney stones  Reports symptoms with urinary frequency and some back pain bilateral, Left worse than Right.  Last kidney stone about 1 year ago, 11/2019, she has seen [facility], she has had lithotripsy in the past.  Currently admits some mild nausea, no vomiting  Denies fever, body aches, cough congestion, abdominal pain, hematuria, dysuria   Depression screen [facility] 2/9 07/03/2019 06/27/2018  Decreased Interest 0 0  Down, Depressed, Hopeless 0 0  PHQ - 2 Score 0 0  Altered sleeping - 1  Tired, decreased energy - 0  Change in appetite - 0  Feeling bad or failure about yourself  - 0  Trouble concentrating - 0  Moving slowly or fidgety/restless - 0  Suicidal thoughts - 0  PHQ-9 Score - 1  Difficult doing work/chores - Not difficult at all    Social History   Tobacco Use   Smoking status: Former    Packs/day: 0.10    Types: Cigarettes   Smokeless tobacco: Never   Tobacco comments:    5 cigarettes a week  Vaping Use   Vaping Use: Never used  Substance Use Topics   Alcohol use: Yes    Alcohol/week: 1.0 standard drink    Types: 1 Cans of beer per week    Comment: occasionally   Drug use: Never    Review of Systems Per HPI unless specifically indicated above     Objective:    BP 130/73   Pulse 76   Ht 5\' 3"  (1.6 m)   Wt 216 lb 9.6 oz (98.2 kg)   SpO2 100%   BMI 38.37 kg/m   Wt Readings from Last 3 Encounters:  11/26/20 216 lb 9.6 oz (98.2 kg)  04/01/20 219 lb 3.2 oz (99.4 kg)  12/06/19 220 lb 0.3 oz (99.8 kg)    Physical Exam Vitals and nursing note reviewed.  Constitutional:      General: She is  not in acute distress.    Appearance: Normal appearance. She is well-developed. She is not diaphoretic.     Comments: Well-appearing, comfortable, cooperative  HENT:     Head: Normocephalic and atraumatic.  Eyes:     General:        Right eye: No discharge.        Left eye: No discharge.     Conjunctiva/sclera: Conjunctivae normal.  Cardiovascular:     Rate and Rhythm: Normal rate.  Pulmonary:     Effort: Pulmonary effort is normal.  Musculoskeletal:     Comments: L flank pain  Skin:    General: Skin is warm and dry.     Findings: No erythema or rash.  Neurological:     Mental Status: She is alert and oriented to person, place, and time.  Psychiatric:        Mood and Affect: Mood normal.        Behavior: Behavior normal.        Thought Content: Thought content normal.     Comments: Well groomed, good eye contact, normal speech and thoughts   Results for orders placed or performed in visit  on 11/26/20  POCT Urinalysis Dipstick  Result Value Ref Range   Color, UA Yellow    Clarity, UA Cloudy    Glucose, UA Negative Negative   Bilirubin, UA Negative    Ketones, UA Negative    Spec Grav, UA 1.020 1.010 - 1.025   Blood, UA Trace    pH, UA 6.0 5.0 - 8.0   Protein, UA Positive (A) Negative   Urobilinogen, UA 1.0 0.2 or 1.0 E.U./dL   Nitrite, UA Negative    Leukocytes, UA Trace (A) Negative   Appearance     Odor        Assessment & Plan:   Problem List Items Addressed This Visit   None Visit Diagnoses     Acute cystitis with hematuria    -  Primary   Relevant Medications   cephALEXin (KEFLEX) 500 MG capsule   Other Relevant Orders   Ambulatory referral to Urology   Urinary frequency       Relevant Orders   POCT Urinalysis Dipstick (Completed)   Urine Culture   Left nephrolithiasis       Relevant Medications   cyclobenzaprine (FLEXERIL) 10 MG tablet   ibuprofen (ADVIL) 600 MG tablet   tamsulosin (FLOMAX) 0.4 MG CAPS capsule   Other Relevant Orders    Ambulatory referral to Urology   Acute left-sided low back pain without sciatica       Relevant Medications   cyclobenzaprine (FLEXERIL) 10 MG tablet   ibuprofen (ADVIL) 600 MG tablet   Nausea       Relevant Medications   ondansetron (ZOFRAN ODT) 4 MG disintegrating tablet       Urinary frequency Possible UTI based on UA today History of nephrolithiasis, symptoms suggestive of L nephrolithiasis as well or at risk of stone  Check urine culture Start Keflex 500 TID x 7 days Rx Flexeril PRN flank pain stone Ibuprofen 600mg  TID PRN Zofran ODT Flomax daily x 1-2 week for possible stone  Referral to [facility] to return to them.  Defer imaging [facility] xray today  Return criteria given if severe or worsening symptoms.  Orders Placed This Encounter  Procedures   Urine Culture   Ambulatory referral to Urology    Referral Priority:   Routine    Referral Type:   Consultation    Referral Reason:   Specialty Services Required    Requested Specialty:   Urology    Number of Visits Requested:   1   POCT Urinalysis Dipstick     Meds ordered this encounter  Medications   cyclobenzaprine (FLEXERIL) 10 MG tablet    Sig: Take 1 tablet (10 mg total) by mouth 2 (two) times daily as needed for muscle spasms.    Dispense:  30 tablet    Refill:  1   ondansetron (ZOFRAN ODT) 4 MG disintegrating tablet    Sig: Take 1 tablet (4 mg total) by mouth every 8 (eight) hours as needed for nausea or vomiting.    Dispense:  30 tablet    Refill:  0   ibuprofen (ADVIL) 600 MG tablet    Sig: Take 1 tablet (600 mg total) by mouth every 8 (eight) hours as needed.    Dispense:  60 tablet    Refill:  1   cephALEXin (KEFLEX) 500 MG capsule    Sig: Take 1 capsule (500 mg total) by mouth 3 (three) times daily. For 7 days    Dispense:  21 capsule    Refill:  0   tamsulosin (FLOMAX) 0.4 MG CAPS capsule    Sig: Take 1 capsule (0.4 mg total) by mouth daily. For 1-2 weeks for kidney stone    Dispense:  30  capsule    Refill:  0      Follow up plan: Return if symptoms worsen or fail to improve.   Saralyn Pilar, DO [facility] 11/26/2020, 4:22 PM

## 2020-11-27 LAB — URINE CULTURE
MICRO NUMBER:: 12198491
SPECIMEN QUALITY:: ADEQUATE

## 2021-03-04 ENCOUNTER — Other Ambulatory Visit: Payer: Self-pay

## 2021-03-17 ENCOUNTER — Other Ambulatory Visit: Payer: Self-pay

## 2021-04-13 ENCOUNTER — Other Ambulatory Visit: Payer: Self-pay

## 2021-04-26 HISTORY — PX: TRIGGER FINGER RELEASE: SHX641

## 2021-06-02 ENCOUNTER — Encounter: Payer: Self-pay | Admitting: Internal Medicine

## 2021-06-02 ENCOUNTER — Ambulatory Visit
Admission: RE | Admit: 2021-06-02 | Discharge: 2021-06-02 | Disposition: A | Discharge status: Home / Self Care | Payer: No Typology Code available for payment source | Source: Ambulatory Visit | Attending: Internal Medicine | Admitting: Internal Medicine

## 2021-06-02 ENCOUNTER — Ambulatory Visit (INDEPENDENT_AMBULATORY_CARE_PROVIDER_SITE_OTHER): Payer: No Typology Code available for payment source | Admitting: Internal Medicine

## 2021-06-02 ENCOUNTER — Ambulatory Visit
Admission: RE | Admit: 2021-06-02 | Discharge: 2021-06-02 | Disposition: A | Discharge status: Home / Self Care | Payer: No Typology Code available for payment source | Attending: Internal Medicine | Admitting: Internal Medicine

## 2021-06-02 ENCOUNTER — Other Ambulatory Visit: Payer: Self-pay

## 2021-06-02 VITALS — BP 133/73 | HR 76 | Temp 97.5°F | Wt 220.0 lb

## 2021-06-02 DIAGNOSIS — M25512 Pain in left shoulder: Secondary | ICD-10-CM

## 2021-06-02 DIAGNOSIS — R635 Abnormal weight gain: Secondary | ICD-10-CM | POA: Diagnosis not present

## 2021-06-02 DIAGNOSIS — K219 Gastro-esophageal reflux disease without esophagitis: Secondary | ICD-10-CM

## 2021-06-02 DIAGNOSIS — Z6838 Body mass index (BMI) 38.0-38.9, adult: Secondary | ICD-10-CM

## 2021-06-02 DIAGNOSIS — I10 Essential (primary) hypertension: Secondary | ICD-10-CM

## 2021-06-02 DIAGNOSIS — R232 Flushing: Secondary | ICD-10-CM

## 2021-06-02 DIAGNOSIS — R454 Irritability and anger: Secondary | ICD-10-CM

## 2021-06-02 DIAGNOSIS — M65331 Trigger finger, right middle finger: Secondary | ICD-10-CM

## 2021-06-02 DIAGNOSIS — G8929 Other chronic pain: Secondary | ICD-10-CM

## 2021-06-02 DIAGNOSIS — I499 Cardiac arrhythmia, unspecified: Secondary | ICD-10-CM

## 2021-06-02 DIAGNOSIS — E6609 Other obesity due to excess calories: Secondary | ICD-10-CM | POA: Insufficient documentation

## 2021-06-02 DIAGNOSIS — E781 Pure hyperglyceridemia: Secondary | ICD-10-CM

## 2021-06-02 NOTE — Patient Instructions (Signed)
Heart-Healthy Eating Plan Heart-healthy meal planning includes: Eating less unhealthy fats. Eating more healthy fats. Making other changes in your diet. Talk with your doctor or a diet specialist (dietitian) to create an eating plan that is right for you. What is my plan? Your doctor may recommend an eating plan that includes: Total fat: ______% or less of total calories a day. Saturated fat: ______% or less of total calories a day. Cholesterol: less than _________mg a day. What are tips for following this plan? Cooking Avoid frying your food. Try to bake, boil, grill, or broil it instead. You can also reduce fat by: Removing the skin from poultry. Removing all visible fats from meats. Steaming vegetables in water or broth. Meal planning  At meals, divide your plate into four equal parts: Fill one-half of your plate with vegetables and green salads. Fill one-fourth of your plate with whole grains. Fill one-fourth of your plate with lean protein foods. Eat 4-5 servings of vegetables per day. A serving of vegetables is: 1 cup of raw or cooked vegetables. 2 cups of raw leafy greens. Eat 4-5 servings of fruit per day. A serving of fruit is: 1 medium whole fruit.  cup of dried fruit.  cup of fresh, frozen, or canned fruit.  cup of 100% fruit juice. Eat more foods that have soluble fiber. These are apples, broccoli, carrots, beans, peas, and barley. Try to get 20-30 g of fiber per day. Eat 4-5 servings of nuts, legumes, and seeds per week: 1 serving of dried beans or legumes equals  cup after being cooked. 1 serving of nuts is  cup. 1 serving of seeds equals 1 tablespoon. General information Eat more home-cooked food. Eat less restaurant, buffet, and fast food. Limit or avoid alcohol. Limit foods that are high in starch and sugar. Avoid fried foods. Lose weight if you are overweight. Keep track of how much salt (sodium) you eat. This is important if you have high blood  pressure. Ask your doctor to tell you more about this. Try to add vegetarian meals each week. Fats Choose healthy fats. These include olive oil and canola oil, flaxseeds, walnuts, almonds, and seeds. Eat more omega-3 fats. These include salmon, mackerel, sardines, tuna, flaxseed oil, and ground flaxseeds. Try to eat fish at least 2 times each week. Check food labels. Avoid foods with trans fats or high amounts of saturated fat. Limit saturated fats. These are often found in animal products, such as meats, butter, and cream. These are also found in plant foods, such as palm oil, palm kernel oil, and coconut oil. Avoid foods with partially hydrogenated oils in them. These have trans fats. Examples are stick margarine, some tub margarines, cookies, crackers, and other baked goods. What foods can I eat? Fruits All fresh, canned (in natural juice), or frozen fruits. Vegetables Fresh or frozen vegetables (raw, steamed, roasted, or grilled). Green salads. Grains Most grains. Choose whole wheat and whole grains most of the time. Rice and pasta, including brown rice and pastas made with whole wheat. Meats and other proteins Lean, well-trimmed beef, veal, pork, and lamb. Chicken and turkey without skin. All fish and shellfish. Wild duck, rabbit, pheasant, and venison. Egg whites or low-cholesterol egg substitutes. Dried beans, peas, lentils, and tofu. Seeds and most nuts. Dairy Low-fat or nonfat cheeses, including ricotta and mozzarella. Skim or 1% milk that is liquid, powdered, or evaporated. Buttermilk that is made with low-fat milk. Nonfat or low-fat yogurt. Fats and oils Non-hydrogenated (trans-free) margarines. Vegetable oils, including   soybean, sesame, sunflower, olive, peanut, safflower, corn, canola, and cottonseed. Salad dressings or mayonnaise made with a vegetable oil. Beverages Mineral water. Coffee and tea. Diet carbonated beverages. Sweets and desserts Sherbet, gelatin, and fruit ice.  Small amounts of dark chocolate. Limit all sweets and desserts. Seasonings and condiments All seasonings and condiments. The items listed above may not be a complete list of foods and drinks you can eat. Contact a dietitian for more options. What foods should I avoid? Fruits Canned fruit in heavy syrup. Fruit in cream or butter sauce. Fried fruit. Limit coconut. Vegetables Vegetables cooked in cheese, cream, or butter sauce. Fried vegetables. Grains Breads that are made with saturated or trans fats, oils, or whole milk. Croissants. Sweet rolls. Donuts. High-fat crackers, such as cheese crackers. Meats and other proteins Fatty meats, such as hot dogs, ribs, sausage, bacon, rib-eye roast or steak. High-fat deli meats, such as salami and bologna. Caviar. Domestic duck and goose. Organ meats, such as liver. Dairy Cream, sour cream, cream cheese, and creamed cottage cheese. Whole-milk cheeses. Whole or 2% milk that is liquid, evaporated, or condensed. Whole buttermilk. Cream sauce or high-fat cheese sauce. Yogurt that is made from whole milk. Fats and oils Meat fat, or shortening. Cocoa butter, hydrogenated oils, palm oil, coconut oil, palm kernel oil. Solid fats and shortenings, including bacon fat, salt pork, lard, and butter. Nondairy cream substitutes. Salad dressings with cheese or sour cream. Beverages Regular sodas and juice drinks with added sugar. Sweets and desserts Frosting. Pudding. Cookies. Cakes. Pies. Milk chocolate or white chocolate. Buttered syrups. Full-fat ice cream or ice cream drinks. The items listed above may not be a complete list of foods and drinks to avoid. Contact a dietitian for more information. Summary Heart-healthy meal planning includes eating less unhealthy fats, eating more healthy fats, and making other changes in your diet. Eat a balanced diet. This includes fruits and vegetables, low-fat or nonfat dairy, lean protein, nuts and legumes, whole grains, and  heart-healthy oils and fats. This information is not intended to replace advice given to you by your health care provider. Make sure you discuss any questions you have with your health care provider. Document Revised: 08/21/2020 Document Reviewed: 08/21/2020 Elsevier Patient Education  2022 [employer]  

## 2021-06-02 NOTE — Progress Notes (Signed)
Subjective:    Patient ID: Caroline Turner, female    DOB: [birth year], 52 y.o.   MRN: [id number]  HPI  Patient presents to clinic today for follow-up of chronic conditions.  HTN: Her BP today is 133/73.  She is taking HCTZ as prescribed.  ECG from 11/2019 reviewed.  GERD: Triggered by spicy foods.  She takes Tums as needed with good relief of symptoms.  There is no upper GI on file.  HLD: Her last LDL was 92, triglycerides 841, 06/2019.  She is not taking any cholesterol-lowering medication at this time.  She tries to consume a low-fat diet.  Chronic Left Shoulder Pain: s/p labral repair 2013. She describes the pain achy. She reports associated numbness, tingling and weakness of the left upper extremity. She takes Aleve or Ibuprofen with some relief of symptoms. There is no x-ray on file.  She also reports trigger finger of her right middle fingers. She reports her last steroid injection was about 2 years.  She would like referral to [facility] for consideration of trigger finger release.  Review of Systems     Past Medical History:  Diagnosis Date   History of swelling of feet    Hyperlipidemia    Hypertension    Kidney stones    PONV (postoperative nausea and vomiting)     Current Outpatient Medications  Medication Sig Dispense Refill   acetaminophen (TYLENOL) 500 MG tablet Take 1 tablet (500 mg total) by mouth every 6 (six) hours as needed. For use AFTER surgery 30 tablet 0   azelastine (ASTELIN) 0.1 % nasal spray Place 2 sprays into both nostrils 2 (two) times daily. Use in each nostril as directed 30 mL 12   cephALEXin (KEFLEX) 500 MG capsule Take 1 capsule (500 mg total) by mouth 3 (three) times daily. For 7 days 21 capsule 0   cyclobenzaprine (FLEXERIL) 10 MG tablet Take 1 tablet (10 mg total) by mouth 2 (two) times daily as needed for muscle spasms. 30 tablet 1   Ergocalciferol (VITAMIN D2) 50 MCG (2000 UT) TABS Take by mouth.      hydrochlorothiazide (HYDRODIURIL) 25 MG  tablet TAKE 1 TABLET BY MOUTH DAILY 90 tablet 1   ibuprofen (ADVIL) 600 MG tablet Take 1 tablet (600 mg total) by mouth every 8 (eight) hours as needed. 60 tablet 1   ondansetron (ZOFRAN ODT) 4 MG disintegrating tablet Take 1 tablet (4 mg total) by mouth every 8 (eight) hours as needed for nausea or vomiting. 30 tablet 0   tamsulosin (FLOMAX) 0.4 MG CAPS capsule Take 1 capsule (0.4 mg total) by mouth daily for 1-2 weeks for kidney stone 30 capsule 0   vitamin B-12 (CYANOCOBALAMIN) 100 MCG tablet Take 100 mcg by mouth daily.     No current facility-administered medications for this visit.    No Known Allergies  Family History  Problem Relation Age of Onset   Cancer Father        lung, liver, mets to brain   Dementia Father    Vascular Disease Mother    Dementia Mother        vascular dementia   Alzheimer's disease Mother     Social History   Socioeconomic History   Marital status: Married    Spouse name: Not on file   Number of children: 3   Years of education: Not on file   Highest education level: Master's degree (e.g., MA, MS, MEng, MEd, MSW, MBA)  Occupational History   Not  on file  Tobacco Use   Smoking status: Former    Packs/day: 0.10    Types: Cigarettes   Smokeless tobacco: Never   Tobacco comments:    5 cigarettes a week  Vaping Use   Vaping Use: Never used  Substance and Sexual Activity   Alcohol use: Yes    Alcohol/week: 1.0 standard drink    Types: 1 Cans of beer per week    Comment: occasionally   Drug use: Never   Sexual activity: Yes  Other Topics Concern   Not on file  Social History Narrative   Not on file   Social Determinants of Health   Financial Resource Strain: Not on file  Food Insecurity: Not on file  Transportation Needs: Not on file  Physical Activity: Not on file  Stress: Not on file  Social Connections: Not on file  Intimate Partner Violence: Not on file     Constitutional: Denies fever, malaise, fatigue, headache or abrupt  weight changes.  Respiratory: Denies difficulty breathing, shortness of breath, cough or sputum production.   Cardiovascular: Denies chest pain, chest tightness, palpitations or swelling in the hands or feet.  Gastrointestinal: Patient reports intermittent reflux.  Denies abdominal pain, bloating, constipation, diarrhea or blood in the stool.  Musculoskeletal: Patient reports chronic left shoulder pain, decreased range of motion, trigger finger of right middle finger.  Denies difficulty with gait, muscle pain or joint swelling.  Skin: Denies redness, rashes, lesions or ulcercations.  Neurological: Patient reports numbness, tingling weakness of her left upper extremity.  Denies dizziness, difficulty with memory, difficulty with speech or problems with balance and coordination.    No other specific complaints in a complete review of systems (except as listed in HPI above).  Objective:   Physical Exam   BP 133/73 (BP Location: Left Arm, Patient Position: Sitting, Cuff Size: Large)    Pulse 76    Temp (!) 97.5 F (36.4 C) (Temporal)    Wt 220 lb (99.8 kg)    SpO2 98%    BMI 38.97 kg/m   Wt Readings from Last 3 Encounters:  11/26/20 216 lb 9.6 oz (98.2 kg)  04/01/20 219 lb 3.2 oz (99.4 kg)  12/06/19 220 lb 0.3 oz (99.8 kg)    General: Appears her stated age, obese, in NAD. Skin: Warm, dry and intact.  HEENT: Head: normal shape and size; Eyes: EOMs intact;  Cardiovascular: Normal rate and rhythm with occasional ectopic beat noted. S1,S2 noted.  No murmur, rubs or gallops noted.  Radial pulses 2+ bilaterally.  No JVD or BLE edema. No carotid bruits noted. Pulmonary/Chest: Normal effort and positive vesicular breath sounds. No respiratory distress. No wheezes, rales or ronchi noted.  Musculoskeletal: Decreased abduction, internal and external rotation of the left shoulder.  Normal abduction.  Pain with palpation over the left anterior shoulder.  Negative drop can test on the left.  Strength 5/5  BUE.  Handgrips equal.  No difficulty with gait.  Neurological: Alert and oriented. Coordination normal.    BMET    Component Value Date/Time   NA 138 12/04/2019 1450   K 3.6 12/04/2019 1450   CL 101 12/04/2019 1450   CO2 27 12/04/2019 1450   GLUCOSE 96 12/04/2019 1450   BUN 14 12/04/2019 1450   CREATININE 0.47 12/04/2019 1450   CREATININE 0.56 07/03/2019 0933   CALCIUM 9.2 12/04/2019 1450   GFRNONAA >60 12/04/2019 1450   GFRNONAA 109 07/03/2019 0933   GFRAA >60 12/04/2019 1450   GFRAA 126  07/03/2019 0933    Lipid Panel     Component Value Date/Time   CHOL 183 07/03/2019 0933   TRIG 126 07/03/2019 0933   HDL 68 07/03/2019 0933   CHOLHDL 2.7 07/03/2019 0933   LDLCALC 92 07/03/2019 0933    CBC    Component Value Date/Time   WBC 4.9 07/03/2019 0933   RBC 4.50 07/03/2019 0933   HGB 13.1 07/03/2019 0933   HCT 40.2 07/03/2019 0933   PLT 260 07/03/2019 0933   MCV 89.3 07/03/2019 0933   MCH 29.1 07/03/2019 0933   MCHC 32.6 07/03/2019 0933   RDW 12.2 07/03/2019 0933   LYMPHSABS 1,749 07/03/2019 0933   MONOABS 0.4 02/20/2018 1601   EOSABS 59 07/03/2019 0933   BASOSABS 20 07/03/2019 0933    Hgb A1C Lab Results  Component Value Date   HGBA1C 5.4 06/27/2018           Assessment & Plan:   Irregular Heart Rhythm:  Indication for ECG: Irregular heart rhythm Interpretation of ECG: Normal rate and rhythm Comparison of ECG: 11/2019, unchanged We will check TSH today  Abnormal Weight gain, Irritability, Hot Flashes:  Early hysterectomy We will check FSH/LH We will discuss weight loss options pending labs  Trigger Finger, Middle Finger, Right Hand:  Referral to orthopedics for further evaluation and treatment per patient request  We will follow-up after labs with further recommendation and treatment plan RTC in 6 months for your annual exam  Nicki Reaper, NP This visit occurred during the SARS-CoV-2 public health emergency.  Safety protocols were in place,  including screening questions prior to the visit, additional usage of staff PPE, and extensive cleaning of exam room while observing appropriate contact time as indicated for disinfecting solutions.

## 2021-06-02 NOTE — Assessment & Plan Note (Signed)
X-ray left shoulder today Referral to [facility] for further evaluation and treatment

## 2021-06-02 NOTE — Assessment & Plan Note (Signed)
Controlled on HCTZ Reinforced DASH diet and exercise for weight loss BMET today

## 2021-06-02 NOTE — Assessment & Plan Note (Signed)
Lipid profile today Encouraged her to consume a low-fat diet and increase aerobic exercise

## 2021-06-02 NOTE — Assessment & Plan Note (Signed)
Encourage diet and exercise for weight loss 

## 2021-06-02 NOTE — Assessment & Plan Note (Signed)
Avoid foods that trigger reflux Encourage weight loss as this can produce reflux symptoms Continue Tums OTC as needed

## 2021-06-03 LAB — BASIC METABOLIC PANEL
BUN: 13 mg/dL (ref 7–25)
CO2: 29 mmol/L (ref 20–32)
Calcium: 9.5 mg/dL (ref 8.6–10.4)
Chloride: 102 mmol/L (ref 98–110)
Creat: 0.53 mg/dL (ref 0.50–1.03)
Glucose, Bld: 93 mg/dL (ref 65–99)
Potassium: 3.9 mmol/L (ref 3.5–5.3)
Sodium: 139 mmol/L (ref 135–146)

## 2021-06-03 LAB — LIPID PANEL
Cholesterol: 204 mg/dL — ABNORMAL HIGH (ref <200)
HDL: 69 mg/dL (ref >=50)
LDL Cholesterol (Calc): 94 mg/dL (calc)
Non-HDL Cholesterol (Calc): 135 mg/dL (calc) — ABNORMAL HIGH (ref <130)
Total CHOL/HDL Ratio: 3 (calc) (ref <5.0)
Triglycerides: 304 mg/dL — ABNORMAL HIGH (ref <150)

## 2021-06-03 LAB — TSH: TSH: 1.31 mIU/L

## 2021-06-03 LAB — FSH/LH
FSH: 37.3 m[IU]/mL
LH: 17.2 m[IU]/mL

## 2021-06-09 ENCOUNTER — Encounter: Payer: Self-pay | Admitting: Internal Medicine

## 2021-06-17 ENCOUNTER — Other Ambulatory Visit: Payer: Self-pay

## 2021-06-17 MED ORDER — MELOXICAM 15 MG PO TABS
ORAL_TABLET | ORAL | 2 refills | Status: DC
  Filled 2021-06-17: qty 30, 30d supply, fill #0

## 2021-07-09 ENCOUNTER — Other Ambulatory Visit: Payer: Self-pay

## 2021-07-09 MED ORDER — TRAMADOL HCL 50 MG PO TABS
ORAL_TABLET | ORAL | 0 refills | Status: DC
Start: 2021-07-09 — End: 2021-10-03
  Filled 2021-07-09: qty 30, 7d supply, fill #0

## 2021-07-09 MED ORDER — SULFAMETHOXAZOLE-TRIMETHOPRIM 800-160 MG PO TABS
ORAL_TABLET | ORAL | 0 refills | Status: DC
  Filled 2021-07-09: qty 24, 12d supply, fill #0

## 2021-07-14 ENCOUNTER — Other Ambulatory Visit: Payer: Self-pay | Admitting: Internal Medicine

## 2021-07-14 ENCOUNTER — Encounter: Payer: Self-pay | Admitting: Internal Medicine

## 2021-07-14 ENCOUNTER — Other Ambulatory Visit: Payer: Self-pay

## 2021-07-14 MED ORDER — FENOFIBRATE 54 MG PO TABS
54.0000 mg | ORAL_TABLET | Freq: Every day | ORAL | 0 refills | Status: DC
  Filled 2021-07-14: qty 90, 90d supply, fill #0

## 2021-07-14 NOTE — Progress Notes (Signed)
f °

## 2021-09-30 ENCOUNTER — Ambulatory Visit: Payer: Self-pay

## 2021-09-30 ENCOUNTER — Other Ambulatory Visit: Payer: Self-pay

## 2021-09-30 ENCOUNTER — Inpatient Hospital Stay
Admission: EM | Admit: 2021-09-30 | Discharge: 2021-10-03 | DRG: 395 | Disposition: A | Discharge status: Home / Self Care | Payer: No Typology Code available for payment source | Attending: Internal Medicine | Admitting: Internal Medicine

## 2021-09-30 ENCOUNTER — Emergency Department: Payer: No Typology Code available for payment source

## 2021-09-30 DIAGNOSIS — Z808 Family history of malignant neoplasm of other organs or systems: Secondary | ICD-10-CM

## 2021-09-30 DIAGNOSIS — Z90711 Acquired absence of uterus with remaining cervical stump: Secondary | ICD-10-CM | POA: Diagnosis not present

## 2021-09-30 DIAGNOSIS — Z801 Family history of malignant neoplasm of trachea, bronchus and lung: Secondary | ICD-10-CM | POA: Diagnosis not present

## 2021-09-30 DIAGNOSIS — Z79899 Other long term (current) drug therapy: Secondary | ICD-10-CM

## 2021-09-30 DIAGNOSIS — E876 Hypokalemia: Secondary | ICD-10-CM | POA: Diagnosis present

## 2021-09-30 DIAGNOSIS — Z791 Long term (current) use of non-steroidal anti-inflammatories (NSAID): Secondary | ICD-10-CM

## 2021-09-30 DIAGNOSIS — K219 Gastro-esophageal reflux disease without esophagitis: Secondary | ICD-10-CM | POA: Diagnosis present

## 2021-09-30 DIAGNOSIS — K631 Perforation of intestine (nontraumatic): Secondary | ICD-10-CM | POA: Diagnosis present

## 2021-09-30 DIAGNOSIS — Z87442 Personal history of urinary calculi: Secondary | ICD-10-CM | POA: Diagnosis not present

## 2021-09-30 DIAGNOSIS — E785 Hyperlipidemia, unspecified: Secondary | ICD-10-CM | POA: Diagnosis present

## 2021-09-30 DIAGNOSIS — Z9049 Acquired absence of other specified parts of digestive tract: Secondary | ICD-10-CM | POA: Diagnosis not present

## 2021-09-30 DIAGNOSIS — Z87891 Personal history of nicotine dependence: Secondary | ICD-10-CM | POA: Diagnosis not present

## 2021-09-30 DIAGNOSIS — Z820 Family history of epilepsy and other diseases of the nervous system: Secondary | ICD-10-CM

## 2021-09-30 DIAGNOSIS — I10 Essential (primary) hypertension: Secondary | ICD-10-CM | POA: Diagnosis present

## 2021-09-30 DIAGNOSIS — K5780 Diverticulitis of intestine, part unspecified, with perforation and abscess without bleeding: Secondary | ICD-10-CM | POA: Diagnosis present

## 2021-09-30 LAB — COMPREHENSIVE METABOLIC PANEL
ALT: 29 U/L (ref 0–44)
AST: 25 U/L (ref 15–41)
Albumin: 3.9 g/dL (ref 3.5–5.0)
Alkaline Phosphatase: 70 U/L (ref 38–126)
Anion gap: 8 (ref 5–15)
BUN: 15 mg/dL (ref 6–20)
CO2: 24 mmol/L (ref 22–32)
Calcium: 9 mg/dL (ref 8.9–10.3)
Chloride: 106 mmol/L (ref 98–111)
Creatinine, Ser: 0.55 mg/dL (ref 0.44–1.00)
GFR, Estimated: >60 mL/min (ref >60)
Glucose, Bld: 182 mg/dL — ABNORMAL HIGH (ref 70–99)
Potassium: 3.4 mmol/L — ABNORMAL LOW (ref 3.5–5.1)
Sodium: 138 mmol/L (ref 135–145)
Total Bilirubin: 0.4 mg/dL (ref 0.3–1.2)
Total Protein: 7.4 g/dL (ref 6.5–8.1)

## 2021-09-30 LAB — CBC
HCT: 38.9 % (ref 36.0–46.0)
Hemoglobin: 12.7 g/dL (ref 12.0–15.0)
MCH: 29.5 pg (ref 26.0–34.0)
MCHC: 32.6 g/dL (ref 30.0–36.0)
MCV: 90.3 fL (ref 80.0–100.0)
Platelets: 248 10*3/uL (ref 150–400)
RBC: 4.31 MIL/uL (ref 3.87–5.11)
RDW: 11.9 % (ref 11.5–15.5)
WBC: 7.6 10*3/uL (ref 4.0–10.5)
nRBC: 0 % (ref 0.0–0.2)

## 2021-09-30 LAB — URINALYSIS, ROUTINE W REFLEX MICROSCOPIC
Bilirubin Urine: NEGATIVE
Glucose, UA: NEGATIVE mg/dL
Hgb urine dipstick: NEGATIVE
Ketones, ur: NEGATIVE mg/dL
Leukocytes,Ua: NEGATIVE
Nitrite: NEGATIVE
Protein, ur: NEGATIVE mg/dL
Specific Gravity, Urine: 1.026 (ref 1.005–1.030)
pH: 5 (ref 5.0–8.0)

## 2021-09-30 LAB — LIPASE, BLOOD: Lipase: 27 U/L (ref 11–51)

## 2021-09-30 MED ORDER — ONDANSETRON HCL 4 MG PO TABS: 4.0000 mg | ORAL_TABLET | Freq: Four times a day (QID) | ORAL | Status: DC | PRN

## 2021-09-30 MED ORDER — ACETAMINOPHEN 650 MG RE SUPP: 650.0000 mg | Freq: Four times a day (QID) | RECTAL | Status: DC | PRN

## 2021-09-30 MED ORDER — METRONIDAZOLE 500 MG/100ML IV SOLN
500.0000 mg | Freq: Two times a day (BID) | INTRAVENOUS | Status: DC
  Administered 2021-09-30 – 2021-10-01 (×2): 500 mg via INTRAVENOUS
  Filled 2021-09-30 (×2): qty 100

## 2021-09-30 MED ORDER — LACTATED RINGERS IV SOLN: INTRAVENOUS | Status: DC

## 2021-09-30 MED ORDER — HYDRALAZINE HCL 20 MG/ML IJ SOLN: 5.0000 mg | Freq: Four times a day (QID) | INTRAMUSCULAR | Status: DC | PRN

## 2021-09-30 MED ORDER — SODIUM CHLORIDE 0.9 % IV SOLN
2.0000 g | Freq: Three times a day (TID) | INTRAVENOUS | Status: DC
  Administered 2021-10-01: 2 g via INTRAVENOUS
  Filled 2021-09-30 (×2): qty 12.5

## 2021-09-30 MED ORDER — PANTOPRAZOLE SODIUM 40 MG IV SOLR
40.0000 mg | INTRAVENOUS | Status: DC
  Administered 2021-10-01 – 2021-10-02 (×3): 40 mg via INTRAVENOUS
  Filled 2021-09-30 (×3): qty 10

## 2021-09-30 MED ORDER — METRONIDAZOLE 500 MG/100ML IV SOLN
500.0000 mg | Freq: Once | INTRAVENOUS | Status: AC
  Administered 2021-09-30: 500 mg via INTRAVENOUS
  Filled 2021-09-30: qty 100

## 2021-09-30 MED ORDER — ENOXAPARIN SODIUM 60 MG/0.6ML IJ SOSY
0.5000 mg/kg | PREFILLED_SYRINGE | INTRAMUSCULAR | Status: DC
Start: 2021-10-01 — End: 2021-10-03
  Administered 2021-10-01 – 2021-10-02 (×2): 50 mg via SUBCUTANEOUS
  Filled 2021-09-30 (×3): qty 0.6

## 2021-09-30 MED ORDER — ONDANSETRON HCL 4 MG/2ML IJ SOLN
4.0000 mg | Freq: Four times a day (QID) | INTRAMUSCULAR | Status: DC | PRN
  Administered 2021-09-30: 4 mg via INTRAVENOUS
  Filled 2021-09-30: qty 2

## 2021-09-30 MED ORDER — HYDROMORPHONE HCL 1 MG/ML IJ SOLN
1.0000 mg | Freq: Once | INTRAMUSCULAR | Status: AC
  Administered 2021-10-01: 1 mg via INTRAVENOUS
  Filled 2021-09-30: qty 1

## 2021-09-30 MED ORDER — SODIUM CHLORIDE 0.9 % IV SOLN
2.0000 g | Freq: Once | INTRAVENOUS | Status: AC
  Administered 2021-09-30: 2 g via INTRAVENOUS
  Filled 2021-09-30: qty 12.5

## 2021-09-30 MED ORDER — ACETAMINOPHEN 325 MG PO TABS
650.0000 mg | ORAL_TABLET | Freq: Four times a day (QID) | ORAL | Status: DC | PRN
  Administered 2021-10-01 – 2021-10-02 (×4): 650 mg via ORAL
  Filled 2021-09-30 (×4): qty 2

## 2021-09-30 MED ORDER — IOHEXOL 300 MG/ML  SOLN
100.0000 mL | Freq: Once | INTRAMUSCULAR | Status: AC | PRN
  Administered 2021-09-30: 100 mL via INTRAVENOUS

## 2021-09-30 MED ORDER — MORPHINE SULFATE (PF) 2 MG/ML IV SOLN
2.0000 mg | INTRAVENOUS | Status: DC | PRN
  Administered 2021-09-30: 2 mg via INTRAVENOUS
  Filled 2021-09-30: qty 1

## 2021-09-30 NOTE — Assessment & Plan Note (Signed)
IV hydralazine as needed while n.p.o. 

## 2021-09-30 NOTE — Telephone Encounter (Signed)
  Chief Complaint: Severe abdominal pain Symptoms: pain around naval shooting towards pt's back Frequency: 2 days - pain increasing Pertinent Negatives: Patient denies fever, vomiting nausea Disposition: [x] ED /[] Urgent Care (no appt availability in office) / [] Appointment(In office/virtual)/ []  [facility]/ [] Home Care/ [x] Refused Recommended Disposition /[] [facility]/ []  Follow-up with PCP Additional Notes: Pt has had increasing abdominal pain since yesterday. Currently pain is 7-8/10. Pt does not want to go to ED and has scheduled OV appt for tomorrow. Urination and BM has provided a very small amount of relief, but pain returns soon after.   Reason for Disposition  [1] SEVERE pain (e.g., excruciating) AND [2] present > 1 hour  Answer Assessment - Initial Assessment Questions 1. LOCATION: "Where does it hurt?"      Right around the navel.  2. RADIATION: "Does the pain shoot anywhere else?" (e.g., chest, back)     To the back 3. ONSET: "When did the pain begin?" (e.g., minutes, hours or days ago)      yesterday 4. SUDDEN: "Gradual or sudden onset?"     sudden 5. PATTERN "Does the pain come and go, or is it constant?"    - If constant: "Is it getting better, staying the same, or worsening?"      (Note: Constant means the pain never goes away completely; most serious pain is constant and it progresses)     - If intermittent: "How long does it last?" "Do you have pain now?"     (Note: Intermittent means the pain goes away completely between bouts)     constant 6. SEVERITY: "How bad is the pain?"  (e.g., Scale 1-10; mild, moderate, or severe)   - MILD (1-3): doesn't interfere with normal activities, abdomen soft and not tender to touch    - MODERATE (4-7): interferes with normal activities or awakens from sleep, abdomen tender to touch    - SEVERE (8-10): excruciating pain, doubled over, unable to do any normal activities      7-8/10 7. RECURRENT SYMPTOM: "Have  you ever had this type of stomach pain before?" If Yes, ask: "When was the last time?" and "What happened that time?"      no 8. CAUSE: "What do you think is causing the stomach pain?"     no 9. RELIEVING/AGGRAVATING FACTORS: "What makes it better or worse?" (e.g., movement, antacids, bowel movement)  BM improves or urinate feels better 10. OTHER SYMPTOMS: "Do you have any other symptoms?" (e.g., back pain, diarrhea, fever, urination pain, vomiting)       no 11. PREGNANCY: "Is there any chance you are pregnant?" "When was your last menstrual period?"       no  Protocols used: Abdominal Pain - [facility]

## 2021-09-30 NOTE — Assessment & Plan Note (Addendum)
Per surgical consult from the ED, plan to manage conservatively as there is no free air seen on CT Keep n.p.o. overnight pending further surgical recommendations IV hydration, IV antiemetics and IV pain meds Surgical consult for further recommendations and to follow

## 2021-09-30 NOTE — Consult Note (Signed)
PHARMACY -  BRIEF ANTIBIOTIC NOTE   Pharmacy has received consult(s) for Cefepime from an ED provider.  The patient's profile has been reviewed for ht/wt/allergies/indication/available labs.    One time order(s) placed for: Cefepime 2g IV x1 in ED  Further antibiotics/pharmacy consults should be ordered by admitting physician if indicated.                       Thank you, Martyn Malay 09/30/2021  7:16 PM

## 2021-09-30 NOTE — Progress Notes (Signed)
Pharmacy Antibiotic Note  Caroline Turner is a 51 y.o. female admitted on 09/30/2021 with suspected contained perforation of the small bowel.  Pharmacy has been consulted for cefepime dosing.  Plan: start cefepime 2 grams IV every 8 hours Follow renal function for needed dose adjustments    Temp (24hrs), Avg:98 F (36.7 C), Min:98 F (36.7 C), Max:98 F (36.7 C)  Recent Labs  Lab 09/30/21 1601  WBC 7.6  CREATININE 0.55    CrCl cannot be calculated (Unknown ideal weight.).    No Known Allergies  Antimicrobials this admission: 06/07 metronidazole >>  06/07 cefepime >>   Microbiology results: no recent   Thank you for allowing pharmacy to be a part of this patient's care.  Dallie Piles 09/30/2021 8:37 PM

## 2021-09-30 NOTE — Assessment & Plan Note (Signed)
-  IV Protonix 

## 2021-09-30 NOTE — Consult Note (Signed)
SURGICAL CONSULTATION NOTE   HISTORY OF PRESENT ILLNESS (HPI):  51 y.o. female presented to [facility] for evaluation of abdominal pain. Patient reports started having abdominal pain in the paramedical area.  Pain started yesterday.  Today the pain radiated to the right side of the abdomen.  There is no alleviating or aggravating factors identified.  Patient does not associate the pain with eating.  Denies any fever or chills.  At the ED she was found with local tenderness in the right side of the abdomen.  Labs shows normal white blood cell count, normal hemoglobin.  There has been no fever, no tachycardia, normal blood pressures.  CT scan of the abdomen and pelvis shows a very localized inflammation with a contained fat cavity in a small portion of the small intestine.  No free air or free fluid.  I personally evaluated the images.  Surgery is consulted by Dr. Para Marchuncan in this context for evaluation and management of small bowel contained perforation.  PAST MEDICAL HISTORY (PMH):  Past Medical History:  Diagnosis Date   History of swelling of feet    Hyperlipidemia    Hypertension    Kidney stones    PONV (postoperative nausea and vomiting)      PAST SURGICAL HISTORY (PSH):  Past Surgical History:  Procedure Laterality Date   BREAST REDUCTION SURGERY Bilateral 12/06/2019   Procedure: BREAST REDUCTION WITH LIPOSUCTION;  Surgeon: Caroline Turner, Caroline S, DO;  Location: [facility];  Service: Plastics;  Laterality: Bilateral;  3.5 hours, please   CHOLECYSTECTOMY     CHOLECYSTECTOMY     NEPHROLITHOTOMY     PARTIAL HYSTERECTOMY     SHOULDER SURGERY Left      MEDICATIONS:  Prior to Admission medications   Medication Sig Start Date End Date Taking? Authorizing Provider  fenofibrate 54 MG tablet Take 1 tablet (54 mg total) by mouth daily. 07/14/21   Lorre MunroeBaity, Caroline W, NP  acetaminophen (TYLENOL) 500 MG tablet Take 1 tablet (500 mg total) by mouth every 6 (six) hours as needed. For  use AFTER surgery 12/06/19   Caroline Turner, Caroline C, PA-Turner  azelastine (ASTELIN) 0.1 % nasal spray Place 2 sprays into both nostrils 2 (two) times daily. Use in each nostril as directed 05/08/20   Malfi, Caroline GrossNicole M, FNP  Ergocalciferol (VITAMIN D2) 50 MCG (2000 UT) TABS Take by mouth.     [provider]  hydrochlorothiazide (HYDRODIURIL) 25 MG tablet TAKE 1 TABLET BY MOUTH DAILY 08/19/20 08/19/21  Lorre MunroeBaity, Caroline W, NP  ibuprofen (ADVIL) 600 MG tablet Take 1 tablet (600 mg total) by mouth every 8 (eight) hours as needed. 11/26/20   Caroline Turner, Caroline NeatAlexander J, DO  meloxicam (MOBIC) 15 MG tablet Take 1 tablet every day by oral route. 06/17/21     sulfamethoxazole-trimethoprim (BACTRIM DS) 800-160 MG tablet Take 1 tablet every 12 hours by oral route for 12 days. 07/09/21     traMADol (ULTRAM) 50 MG tablet Take one tablet by mouth every 6 hours as needed 07/09/21     vitamin B-12 (CYANOCOBALAMIN) 100 MCG tablet Take 100 mcg by mouth daily.    [provider]     ALLERGIES:  No Known Allergies   SOCIAL HISTORY:  Social History   Socioeconomic History   Marital status: Married    Spouse name: Not on file   Number of children: 3   Years of education: Not on file   Highest education level: Master'Turner degree (e.g., MA, MS, MEng, MEd, MSW, MBA)  Occupational History   Not on file  Tobacco Use   Smoking status: Former    Packs/day: 0.10    Types: Cigarettes   Smokeless tobacco: Never   Tobacco comments:    5 cigarettes a week  Vaping Use   Vaping Use: Never used  Substance and Sexual Activity   Alcohol use: Yes    Alcohol/week: 1.0 standard drink    Types: 1 Cans of beer per week    Comment: occasionally   Drug use: Never   Sexual activity: Yes  Other Topics Concern   Not on file  Social History Narrative   Not on file   Social Determinants of Health   Financial Resource Strain: Not on file  Food Insecurity: Not on file  Transportation Needs: Not on file  Physical Activity: Not on  file  Stress: Not on file  Social Connections: Not on file  Intimate Partner Violence: Not on file      FAMILY HISTORY:  Family History  Problem Relation Age of Onset   Cancer Father        lung, liver, mets to brain   Dementia Father    Vascular Disease Mother    Dementia Mother        vascular dementia   Alzheimer'Turner disease Mother      REVIEW OF SYSTEMS:  Constitutional: denies weight loss, fever, chills, or sweats  Eyes: denies any other vision changes, history of eye injury  ENT: denies sore throat, hearing problems  Respiratory: denies shortness of breath, wheezing  Cardiovascular: denies chest pain, palpitations  Gastrointestinal: Positive abdominal pain, nausea and vomiting Genitourinary: denies burning with urination or urinary frequency Musculoskeletal: denies any other joint pains or cramps  Skin: denies any other rashes or skin discolorations  Neurological: denies any other headache, dizziness, weakness  Psychiatric: denies any other depression, anxiety   All other review of systems were negative   VITAL SIGNS:  Temp:  [98 F (36.7 Turner)] 98 F (36.7 Turner) (06/07 1601) Pulse Rate:  [80] 80 (06/07 1601) Resp:  [18] 18 (06/07 1601) BP: (157)/(91) 157/91 (06/07 1601) SpO2:  [97 %] 97 % (06/07 1601)             INTAKE/OUTPUT:  This shift: No intake/output data recorded.  Last 2 shifts: @   PHYSICAL EXAM:  Constitutional:  -- Normal body habitus  -- Awake, alert, and oriented x3  Eyes:  -- Pupils equally round and reactive to light  -- No scleral icterus  Ear, nose, and throat:  -- No jugular venous distension  Pulmonary:  -- No crackles  -- Equal breath sounds bilaterally -- Breathing non-labored at rest Cardiovascular:  -- S1, S2 present  -- No pericardial rubs Gastrointestinal:  -- Abdomen soft, localized tenderness in the right abdomen, non-distended, no guarding or rebound tenderness -- No abdominal masses appreciated, pulsatile or  otherwise  Musculoskeletal and Integumentary:  -- Wounds: None appreciated -- Extremities: B/L UE and LE FROM, hands and feet warm, no edema  Neurologic:  -- Motor function: intact and symmetric -- Sensation: intact and symmetric   Labs:     Latest Ref Rng & Units 09/30/2021    4:01 PM 07/03/2019    9:33 AM 06/27/2018    9:57 AM  CBC  WBC 4.0 - 10.5 K/uL 7.6   4.9   5.0    Hemoglobin 12.0 - 15.0 g/dL 25.9   56.3   87.5    Hematocrit 36.0 - 46.0 % 38.9  40.2   37.6    Platelets 150 - 400 K/uL 248   260   259        Latest Ref Rng & Units 09/30/2021    4:01 PM 06/02/2021    9:23 AM 12/04/2019    2:50 PM  CMP  Glucose 70 - 99 mg/dL 929   93   96    BUN 6 - 20 mg/dL 15   13   14     Creatinine 0.44 - 1.00 mg/dL   2.44   6.28    Sodium 135 - 145 mmol/L 138   139   138    Potassium 3.5 - 5.1 mmol/L 3.4   3.9   3.6    Chloride 98 - 111 mmol/L 106   102   101    CO2 22 - 32 mmol/L 24   29   27     Calcium 8.9 - 10.3 mg/dL 9.0   9.5   9.2    Total Protein 6.5 - 8.1 g/dL 7.4      Total Bilirubin 0.3 - 1.2 mg/dL 0.4      Alkaline Phos 38 - 126 U/L 70      AST 15 - 41 U/L 25      ALT 0 - 44 U/L 29        Imaging studies:  EXAM: CT ABDOMEN AND PELVIS WITH CONTRAST   TECHNIQUE: Multidetector CT imaging of the abdomen and pelvis was performed using the standard protocol following bolus administration of intravenous contrast.   RADIATION DOSE REDUCTION: This exam was performed according to the departmental dose-optimization program which includes automated exposure control, adjustment of the mA and/or kV according to patient size and/or use of iterative reconstruction technique.   CONTRAST:  6.38 OMNIPAQUE IOHEXOL 300 MG/ML  SOLN   COMPARISON:  CT of the abdomen and pelvis 02/24/2018   FINDINGS: Lower chest: No acute abnormality.   Hepatobiliary: No focal liver abnormality is seen. Status post cholecystectomy. No biliary dilatation.   Pancreas: Unremarkable. No pancreatic  ductal dilatation or surrounding inflammatory changes.   Spleen: Normal in size without focal abnormality.   Adrenals/Urinary Tract: Adrenal glands are unremarkable. Kidneys are normal, without renal calculi, focal lesion, or hydronephrosis. Bladder is unremarkable.   Stomach/Bowel: Acute inflammatory process is identified associated with a segment of anterior small bowel in the right mid abdomen just deep to the abdominal wall and roughly at the level of the distal jejunum/proximal ileum. Significant inflammation is noted in the surrounding mesenteric fat with an associated bilobed shaped walled-off area measuring roughly 2.5 x 1.8 x 2.7 cm. Within this area is complex fat density without liquid or air. Findings are suggestive of potentially microperforation of small bowel leading to an area of inflammation and fat necrosis.   No evidence of bowel obstruction, ileus or free intraperitoneal air.   Vascular/Lymphatic: Mild atherosclerosis of the abdominal aorta and common iliac arteries without aneurysm. No lymphadenopathy identified in the abdomen or pelvis.   Reproductive: Status post hysterectomy. No adnexal masses.   Other: No abdominal wall hernia or abnormality. No abdominopelvic ascites.   Musculoskeletal: No acute or significant osseous findings.   IMPRESSION: Acute inflammatory process associated with a segment of small bowel in the anterior mid right peritoneal cavity at the level of distal jejunum/proximal ileum. There is regional inflammation in the mesenteric fat with an associated bilobed area that appears more walled-off but is internally of fat density. This is suggestive of potentially microperforation  of small bowel leading to an area of inflammation and fat necrosis. There is no liquefied abscess at this time or evidence of extraluminal air.     Electronically Signed   By: Irish Lack Turner.D.   On: 09/30/2021 18:28  Assessment/Plan:  51 y.o. female  with suspected contained perforation of the small bowel, complicated by pertinent comorbidities including hypertension, hyperlipidemia, GERD.  Patient with a focal segment of small bowel with inflammatory process with a contained area of inflamed fat.  This contained area does not appear to be air or liquid.  The severity of the process in the small bowel.  CT scan report suggests a microperforation with contained fat necrosis but no abscess or free air.  Other differential diagnosis could be a very focal enteritis process.  Either way since patient has no fever, stable vital signs, abdominal exam with local tenderness on the area of inflammation but no acute abdomen, normal white blood cell count I suggest to treat with IV antibiotic therapy, bowel rest and serial physical exam.  If patient respond to IV antibiotic therapy no further intervention will be needed.  I discussed with patient that if she does not respond to IV antibiotic therapy diagnostic laparoscopy versus exploratory laparotomy might be needed.    There is no indication for any urgent surgical intervention at this moment.  I agree with IV antibiotic therapy.  Bowel rest, IV hydration.  I will follow closely.  Gae Gallop, MD

## 2021-09-30 NOTE — H&P (Signed)
History and Physical    Patient: Caroline PeerBetty Ann Elie ZOX:[id number] DOB: 01/23/1971 DOA: 09/30/2021 DOS: the patient was seen and examined on 09/30/2021 PCP: Lorre MunroeBaity, Regina W, NP  Patient coming from: Home  Chief Complaint:  Chief Complaint  Patient presents with   Abdominal Pain    HPI: Caroline Turner is a 51 y.o. female with medical history significant for Hypertension, hyperlipidemia, GERD and nephrolithiasis who presented with a 1 day history of periumbilical sharp abdominal pain associated with nausea without vomiting she denies dysuria, diarrhea or constipation.  Denies chest pain or shortness of breath, fever or chills.  Has prior cholecystectomy and partial hysterectomy. ED course and data review: BP 157/91 with otherwise normal vitals.  Labs including CBC and CMP for the most part unremarkable except for mild hypokalemia of 3.4.  Lipase normal at 27.  Urinalysis clean.  CT abdomen and pelvis with contrast shows the following findings: IMPRESSION: Acute inflammatory process associated with a segment of small bowel in the anterior mid right peritoneal cavity at the level of distal jejunum/proximal ileum. There is regional inflammation in the mesenteric fat with an associated bilobed area that appears more walled-off but is internally of fat density. This is suggestive of potentially microperforation of small bowel leading to an area of inflammation and fat necrosis. There is no liquefied abscess at this time or evidence of extraluminal air.    The ED provider spoke with on-call surgeon, Dr. Dr. Maia Planintron about the concern for microperforation of the small bowel.  He recommended conservative treatment as no free air was seen and admission to the hospitalist service with surgery following.  Hospitalist thus consulted for admission.  Patient was started on cefepime and metronidazole   Review of Systems: As mentioned in the history of present illness. All other systems reviewed and are  negative.  Past Medical History:  Diagnosis Date   History of swelling of feet    Hyperlipidemia    Hypertension    Kidney stones    PONV (postoperative nausea and vomiting)    Past Surgical History:  Procedure Laterality Date   BREAST REDUCTION SURGERY Bilateral 12/06/2019   Procedure: BREAST REDUCTION WITH LIPOSUCTION;  Surgeon: Peggye Formillingham, Claire S, DO;  Location: [facility];  Service: Plastics;  Laterality: Bilateral;  3.5 hours, please   CHOLECYSTECTOMY     CHOLECYSTECTOMY     NEPHROLITHOTOMY     PARTIAL HYSTERECTOMY     SHOULDER SURGERY Left    Social History:  reports that she has quit smoking. Her smoking use included cigarettes. She smoked an average of .1 packs per day. She has never used smokeless tobacco. She reports current alcohol use of about 1.0 standard drink per week. She reports that she does not use drugs.  No Known Allergies  Family History  Problem Relation Age of Onset   Cancer Father        lung, liver, mets to brain   Dementia Father    Vascular Disease Mother    Dementia Mother        vascular dementia   Alzheimer's disease Mother     Prior to Admission medications   Medication Sig Start Date End Date Taking? Authorizing Provider  fenofibrate 54 MG tablet Take 1 tablet (54 mg total) by mouth daily. 07/14/21   Lorre MunroeBaity, Regina W, NP  acetaminophen (TYLENOL) 500 MG tablet Take 1 tablet (500 mg total) by mouth every 6 (six) hours as needed. For use AFTER surgery 12/06/19   Joni FearsYoung, Johanna  C, PA-C  azelastine (ASTELIN) 0.1 % nasal spray Place 2 sprays into both nostrils 2 (two) times daily. Use in each nostril as directed 05/08/20   Malfi, Jodelle Gross, FNP  Ergocalciferol (VITAMIN D2) 50 MCG (2000 UT) TABS Take by mouth.     [provider]  hydrochlorothiazide (HYDRODIURIL) 25 MG tablet TAKE 1 TABLET BY MOUTH DAILY 08/19/20 08/19/21  Lorre Munroe, NP  ibuprofen (ADVIL) 600 MG tablet Take 1 tablet (600 mg total) by mouth every 8 (eight)  hours as needed. 11/26/20   Karamalegos, Netta Neat, DO  meloxicam (MOBIC) 15 MG tablet Take 1 tablet every day by oral route. 06/17/21     sulfamethoxazole-trimethoprim (BACTRIM DS) 800-160 MG tablet Take 1 tablet every 12 hours by oral route for 12 days. 07/09/21     traMADol (ULTRAM) 50 MG tablet Take one tablet by mouth every 6 hours as needed 07/09/21     vitamin B-12 (CYANOCOBALAMIN) 100 MCG tablet Take 100 mcg by mouth daily.    [provider]    Physical Exam: Vitals:   09/30/21 1601  BP: (!) 157/91  Pulse: 80  Resp: 18  Temp: 98 F (36.7 C)  SpO2: 97%   Physical Exam Vitals and nursing note reviewed.  Constitutional:      General: She is not in acute distress.    Comments: Appears in discomfort from pain  HENT:     Head: Normocephalic and atraumatic.  Cardiovascular:     Rate and Rhythm: Normal rate and regular rhythm.     Heart sounds: Normal heart sounds.  Pulmonary:     Effort: Pulmonary effort is normal.     Breath sounds: Normal breath sounds.  Abdominal:     Palpations: Abdomen is soft.     Tenderness: There is abdominal tenderness in the epigastric area and periumbilical area.  Neurological:     Mental Status: Mental status is at baseline.    Labs on Admission: I have personally reviewed following labs and imaging studies  CBC: Recent Labs  Lab 09/30/21 1601  WBC 7.6  HGB 12.7  HCT 38.9  MCV 90.3  PLT 248   Basic Metabolic Panel: Recent Labs  Lab 09/30/21 1601  NA 138  K 3.4*  CL 106  CO2 24  GLUCOSE 182*  BUN 15  CREATININE 0.55  CALCIUM 9.0   GFR: CrCl cannot be calculated (Unknown ideal weight.). Liver Function Tests: Recent Labs  Lab 09/30/21 1601  AST 25  ALT 29  ALKPHOS 70  BILITOT 0.4  PROT 7.4  ALBUMIN 3.9   Recent Labs  Lab 09/30/21 1601  LIPASE 27   No results for input(s): AMMONIA in the last 168 hours. Coagulation Profile: No results for input(s): INR, PROTIME in the last 168 hours. Cardiac  Enzymes: No results for input(s): CKTOTAL, CKMB, CKMBINDEX, TROPONINI in the last 168 hours. BNP (last 3 results) No results for input(s): PROBNP in the last 8760 hours. HbA1C: No results for input(s): HGBA1C in the last 72 hours. CBG: No results for input(s): GLUCAP in the last 168 hours. Lipid Profile: No results for input(s): CHOL, HDL, LDLCALC, TRIG, CHOLHDL, LDLDIRECT in the last 72 hours. Thyroid Function Tests: No results for input(s): TSH, T4TOTAL, FREET4, T3FREE, THYROIDAB in the last 72 hours. Anemia Panel: No results for input(s): VITAMINB12, FOLATE, FERRITIN, TIBC, IRON, RETICCTPCT in the last 72 hours. Urine analysis:    Component Value Date/Time   COLORURINE YELLOW (A) 09/30/2021 1601   APPEARANCEUR HAZY (A)  09/30/2021 1601   APPEARANCEUR Clear 02/24/2018 1248   LABSPEC 1.026 09/30/2021 1601   PHURINE 5.0 09/30/2021 1601   GLUCOSEU NEGATIVE 09/30/2021 1601   HGBUR NEGATIVE 09/30/2021 1601   BILIRUBINUR NEGATIVE 09/30/2021 1601   BILIRUBINUR Negative 11/26/2020 1605   BILIRUBINUR Negative 02/24/2018 1248   KETONESUR NEGATIVE 09/30/2021 1601   PROTEINUR NEGATIVE 09/30/2021 1601   UROBILINOGEN 1.0 11/26/2020 1605   NITRITE NEGATIVE 09/30/2021 1601   LEUKOCYTESUR NEGATIVE 09/30/2021 1601    Radiological Exams on Admission: CT ABDOMEN PELVIS W CONTRAST  Result Date: 09/30/2021 CLINICAL DATA:  Abdominal pain and nausea. EXAM: CT ABDOMEN AND PELVIS WITH CONTRAST TECHNIQUE: Multidetector CT imaging of the abdomen and pelvis was performed using the standard protocol following bolus administration of intravenous contrast. RADIATION DOSE REDUCTION: This exam was performed according to the departmental dose-optimization program which includes automated exposure control, adjustment of the mA and/or kV according to patient size and/or use of iterative reconstruction technique. CONTRAST:  OMNIPAQUE IOHEXOL 300 MG/ML  SOLN COMPARISON:  CT of the abdomen and pelvis 02/24/2018  FINDINGS: Lower chest: No acute abnormality. Hepatobiliary: No focal liver abnormality is seen. Status post cholecystectomy. No biliary dilatation. Pancreas: Unremarkable. No pancreatic ductal dilatation or surrounding inflammatory changes. Spleen: Normal in size without focal abnormality. Adrenals/Urinary Tract: Adrenal glands are unremarkable. Kidneys are normal, without renal calculi, focal lesion, or hydronephrosis. Bladder is unremarkable. Stomach/Bowel: Acute inflammatory process is identified associated with a segment of anterior small bowel in the right mid abdomen just deep to the abdominal wall and roughly at the level of the distal jejunum/proximal ileum. Significant inflammation is noted in the surrounding mesenteric fat with an associated bilobed shaped walled-off area measuring roughly 2.5 x 1.8 x 2.7 cm. Within this area is complex fat density without liquid or air. Findings are suggestive of potentially microperforation of small bowel leading to an area of inflammation and fat necrosis. No evidence of bowel obstruction, ileus or free intraperitoneal air. Vascular/Lymphatic: Mild atherosclerosis of the abdominal aorta and common iliac arteries without aneurysm. No lymphadenopathy identified in the abdomen or pelvis. Reproductive: Status post hysterectomy. No adnexal masses. Other: No abdominal wall hernia or abnormality. No abdominopelvic ascites. Musculoskeletal: No acute or significant osseous findings. IMPRESSION: Acute inflammatory process associated with a segment of small bowel in the anterior mid right peritoneal cavity at the level of distal jejunum/proximal ileum. There is regional inflammation in the mesenteric fat with an associated bilobed area that appears more walled-off but is internally of fat density. This is suggestive of potentially microperforation of small bowel leading to an area of inflammation and fat necrosis. There is no liquefied abscess at this time or evidence of  extraluminal air. Electronically Signed   By: Irish Lack M.D.   On: 09/30/2021 18:28     Data Reviewed: Relevant notes from primary care and specialist visits, past discharge summaries as available in EHR, including Care Everywhere. Prior diagnostic testing as pertinent to current admission diagnoses Updated medications and problem lists for reconciliation ED course, including vitals, labs, imaging, treatment and response to treatment Triage notes, nursing and pharmacy notes and ED provider's notes Notable results as noted in HPI   Assessment and Plan: * Small bowel microperforation (HCC) Per surgical consult from the ED, plan to manage conservatively as there is no free air seen on CT Keep n.p.o. overnight pending further surgical recommendations IV hydration, IV antiemetics and IV pain meds Surgical consult for further recommendations and to follow  Gastroesophageal reflux disease IV  Protonix  Hypertension IV hydralazine as needed while n.p.o.        DVT prophylaxis: Lovenox  Consults: Surgery, Dr. Maia Plan  Advance Care Planning:   Code Status: Not on file full  Family Communication: none  Disposition Plan: Back to previous home environment  Severity of Illness: The appropriate patient status for this patient is INPATIENT. Inpatient status is judged to be reasonable and necessary in order to provide the required intensity of service to ensure the patient's safety. The patient's presenting symptoms, physical exam findings, and initial radiographic and laboratory data in the context of their chronic comorbidities is felt to place them at high risk for further clinical deterioration. Furthermore, it is not anticipated that the patient will be medically stable for discharge from the hospital within 2 midnights of admission.   * I certify that at the point of admission it is my clinical judgment that the patient will require inpatient hospital care spanning beyond 2  midnights from the point of admission due to high intensity of service, high risk for further deterioration and high frequency of surveillance required.*  Author: Andris Baumann, MD 09/30/2021 8:31 PM  For on call review [email or web address].

## 2021-09-30 NOTE — ED Provider Notes (Signed)
[facility] Emergency Department Provider Note     Event Date/Time   First MD Initiated Contact with Patient 09/30/21 1630     (approximate)   History   Abdominal Pain   HPI  Caroline Turner is a 51 y.o. female with a history of hypertension, nephrolithiasis, hyperlipidemia, and GERD, presents to the ED with onset of belly pain that began last night.  Patient reports some associated nausea but denies any vomiting or dysuria.  She localizes pain to the periumbilical region.  Pain is increased with palpation.  Patient is status post cholecystectomy and partial hysterectomy.     Physical Exam   Triage Vital Signs: ED Triage Vitals  Enc Vitals Group     BP 09/30/21 1601 (!) 157/91     Pulse Rate 09/30/21 1601 80     Resp 09/30/21 1601 18     Temp 09/30/21 1601 98 F (36.7 C)     Temp src --      SpO2 09/30/21 1601 97 %     Weight --      Height --      Head Circumference --      Peak Flow --      Pain Score 09/30/21 1600 9     Pain Loc --      Pain Edu? --      Excl. in GC? --     Most recent vital signs: Vitals:   09/30/21 1601  BP: (!) 157/91  Pulse: 80  Resp: 18  Temp: 98 F (36.7 C)  SpO2: 97%    General Awake, no distress.  CV:  Good peripheral perfusion.  RESP:  Normal effort. CTA ABD:  No distention. Moderately tender to palpation across the upper and lower quadrants. More tender to palp over the RLQ and periumbilical regions. Normal bowel sounds noted. No CVA tenderness   ED Results / Procedures / Treatments   Labs (all labs ordered are listed, but only abnormal results are displayed) Labs Reviewed  COMPREHENSIVE METABOLIC PANEL - Abnormal; Notable for the following components:      Result Value   Potassium 3.4 (*)    Glucose, Bld 182 (*)    All other components within normal limits  URINALYSIS, ROUTINE W REFLEX MICROSCOPIC - Abnormal; Notable for the following components:   Color, Urine YELLOW (*)    APPearance  HAZY (*)    All other components within normal limits  LIPASE, BLOOD  CBC     EKG   RADIOLOGY  I personally viewed and evaluated these images as part of my medical decision making, as well as reviewing the written report by the radiologist.  ED Provider Interpretation: acute inflammatory changes noted}  CT ABDOMEN PELVIS W CONTRAST  Result Date: 09/30/2021 CLINICAL DATA:  Abdominal pain and nausea. EXAM: CT ABDOMEN AND PELVIS WITH CONTRAST TECHNIQUE: Multidetector CT imaging of the abdomen and pelvis was performed using the standard protocol following bolus administration of intravenous contrast. RADIATION DOSE REDUCTION: This exam was performed according to the departmental dose-optimization program which includes automated exposure control, adjustment of the mA and/or kV according to patient size and/or use of iterative reconstruction technique. CONTRAST:  OMNIPAQUE IOHEXOL 300 MG/ML  SOLN COMPARISON:  CT of the abdomen and pelvis 02/24/2018 FINDINGS: Lower chest: No acute abnormality. Hepatobiliary: No focal liver abnormality is seen. Status post cholecystectomy. No biliary dilatation. Pancreas: Unremarkable. No pancreatic ductal dilatation or surrounding inflammatory changes. Spleen: Normal in size without focal  abnormality. Adrenals/Urinary Tract: Adrenal glands are unremarkable. Kidneys are normal, without renal calculi, focal lesion, or hydronephrosis. Bladder is unremarkable. Stomach/Bowel: Acute inflammatory process is identified associated with a segment of anterior small bowel in the right mid abdomen just deep to the abdominal wall and roughly at the level of the distal jejunum/proximal ileum. Significant inflammation is noted in the surrounding mesenteric fat with an associated bilobed shaped walled-off area measuring roughly 2.5 x 1.8 x 2.7 cm. Within this area is complex fat density without liquid or air. Findings are suggestive of potentially microperforation of small bowel  leading to an area of inflammation and fat necrosis. No evidence of bowel obstruction, ileus or free intraperitoneal air. Vascular/Lymphatic: Mild atherosclerosis of the abdominal aorta and common iliac arteries without aneurysm. No lymphadenopathy identified in the abdomen or pelvis. Reproductive: Status post hysterectomy. No adnexal masses. Other: No abdominal wall hernia or abnormality. No abdominopelvic ascites. Musculoskeletal: No acute or significant osseous findings. IMPRESSION: Acute inflammatory process associated with a segment of small bowel in the anterior mid right peritoneal cavity at the level of distal jejunum/proximal ileum. There is regional inflammation in the mesenteric fat with an associated bilobed area that appears more walled-off but is internally of fat density. This is suggestive of potentially microperforation of small bowel leading to an area of inflammation and fat necrosis. There is no liquefied abscess at this time or evidence of extraluminal air. Electronically Signed   By: Irish Lack M.D.   On: 09/30/2021 18:28     PROCEDURES:  Critical Care performed: No  Procedures   MEDICATIONS ORDERED IN ED: Medications  ceFEPIme (MAXIPIME) 2 g in sodium chloride 0.9 % 100 mL IVPB (has no administration in time range)  iohexol (OMNIPAQUE) 300 MG/ML solution 100 mL (100 mLs Intravenous Contrast Given 09/30/21 1804)  metroNIDAZOLE (FLAGYL) IVPB 500 mg (500 mg Intravenous New Bag/Given 09/30/21 1915)     IMPRESSION / MDM / ASSESSMENT AND PLAN / ED COURSE  I reviewed the triage vital signs and the nursing notes.                              Differential diagnosis includes, but is not limited to, ovarian cyst, ovarian torsion, acute appendicitis, diverticulitis, urinary tract infection/pyelonephritis, bowel obstruction, colitis, renal colic, gastroenteritis, hernia, etc.  Patient's presentation is most consistent with acute presentation with potential threat to life or bodily  function.  ----------------------------------------- 7:01 PM on 09/30/2021 ----------------------------------------- S/W Dr. Harl Bowie: no surgical intervention at this time. Admit to hospital service for IV abx.   Patient's diagnosis is consistent with microperforation of the small bowels. Patient will be admitted to the hospital service for non-surgical management. Patient is agreeable to the plan of care.  Patient will be admitted to the hospital service,  Lindajo Royal, MD will admit the patient as discussed.    FINAL CLINICAL IMPRESSION(S) / ED DIAGNOSES   Final diagnoses:  Small bowel perforation (HCC)     Rx / DC Orders   ED Discharge Orders     None        Note:  This document was prepared using Dragon voice recognition software and may include unintentional dictation errors.    Lissa Hoard, PA-C 09/30/21 2018    Concha Se, MD 10/01/21 1146

## 2021-09-30 NOTE — ED Triage Notes (Signed)
Pt comes with c/o belly pain that started last night. Pt states some nausea. Pt denies any trouble with urination.  Pt states pain lower around naval. Pt states pain when pressing on it.

## 2021-10-01 ENCOUNTER — Ambulatory Visit: Payer: No Typology Code available for payment source | Admitting: Family Medicine

## 2021-10-01 DIAGNOSIS — K631 Perforation of intestine (nontraumatic): Secondary | ICD-10-CM | POA: Diagnosis not present

## 2021-10-01 LAB — BASIC METABOLIC PANEL
Anion gap: 2 — ABNORMAL LOW (ref 5–15)
BUN: 13 mg/dL (ref 6–20)
CO2: 28 mmol/L (ref 22–32)
Calcium: 8.5 mg/dL — ABNORMAL LOW (ref 8.9–10.3)
Chloride: 110 mmol/L (ref 98–111)
Creatinine, Ser: 0.51 mg/dL (ref 0.44–1.00)
GFR, Estimated: >60 mL/min (ref >60)
Glucose, Bld: 98 mg/dL (ref 70–99)
Potassium: 3.8 mmol/L (ref 3.5–5.1)
Sodium: 140 mmol/L (ref 135–145)

## 2021-10-01 LAB — CBC
HCT: 34.8 % — ABNORMAL LOW (ref 36.0–46.0)
Hemoglobin: 11.4 g/dL — ABNORMAL LOW (ref 12.0–15.0)
MCH: 30 pg (ref 26.0–34.0)
MCHC: 32.8 g/dL (ref 30.0–36.0)
MCV: 91.6 fL (ref 80.0–100.0)
Platelets: 210 10*3/uL (ref 150–400)
RBC: 3.8 MIL/uL — ABNORMAL LOW (ref 3.87–5.11)
RDW: 12.2 % (ref 11.5–15.5)
WBC: 5.3 10*3/uL (ref 4.0–10.5)
nRBC: 0 % (ref 0.0–0.2)

## 2021-10-01 LAB — HIV ANTIBODY (ROUTINE TESTING W REFLEX): HIV Screen 4th Generation wRfx: NONREACTIVE

## 2021-10-01 MED ORDER — PIPERACILLIN-TAZOBACTAM 3.375 G IVPB
3.3750 g | Freq: Three times a day (TID) | INTRAVENOUS | Status: DC
  Administered 2021-10-01 – 2021-10-03 (×6): 3.375 g via INTRAVENOUS
  Filled 2021-10-01 (×6): qty 50

## 2021-10-01 MED ORDER — HYDROMORPHONE HCL 1 MG/ML IJ SOLN
1.0000 mg | Freq: Once | INTRAMUSCULAR | Status: AC
  Administered 2021-10-01: 1 mg via INTRAVENOUS
  Filled 2021-10-01: qty 1

## 2021-10-01 MED ORDER — POLYETHYLENE GLYCOL 3350 17 G PO PACK
17.0000 g | PACK | Freq: Every day | ORAL | Status: DC | PRN
  Administered 2021-10-01: 17 g via ORAL
  Filled 2021-10-01: qty 1

## 2021-10-01 MED ORDER — MORPHINE SULFATE (PF) 2 MG/ML IV SOLN
1.0000 mg | INTRAVENOUS | Status: DC | PRN
  Administered 2021-10-01: 1 mg via INTRAVENOUS
  Filled 2021-10-01: qty 1

## 2021-10-01 NOTE — Progress Notes (Signed)
PROGRESS NOTE    Caroline Turner  BTD:[id number] DOB: 1970/12/15 DOA: 09/30/2021 PCP: Lorre Munroe, NP    Brief Narrative:  Caroline Turner is a 51 y.o. female with medical history significant for Hypertension, hyperlipidemia, GERD and nephrolithiasis who presented with a 1 day history of periumbilical sharp abdominal pain associated with nausea without vomiting she denies dysuria, diarrhea or constipation.  Denies chest pain or shortness of breath, fever or chills.  Has prior cholecystectomy and partial hysterectomy. ED course and data review: BP 157/91 with otherwise normal vitals.  Labs including CBC and CMP for the most part unremarkable except for mild hypokalemia of 3.4.  Lipase normal at 27.  Urinalysis clean.  CT abdomen and pelvis with contrast shows the following findings: IMPRESSION: Acute inflammatory process associated with a segment of small bowel in the anterior mid right peritoneal cavity at the level of distal jejunum/proximal ileum. There is regional inflammation in the mesenteric fat with an associated bilobed area that appears more walled-off but is internally of fat density. This is suggestive of potentially microperforation of small bowel leading to an area of inflammation and fat necrosis. There is no liquefied abscess at this time or evidence of extraluminal air.     The ED provider spoke with on-call surgeon, Dr. Dr. Maia Plan about the concern for microperforation of the small bowel.  He recommended conservative treatment as no free air was seen and admission to the hospitalist service with surgery following.  Hospitalist thus consulted for admission.  Patient was started on cefepime and metronidazole   6/8 still with abd pain this am. C/o HA.   Consultants:  surgery  Procedures:   Antimicrobials:  zosyn   Subjective: No n/v  Objective: Vitals:   09/30/21 1601 09/30/21 2243 10/01/21 0113 10/01/21 0539  BP: (!) 157/91 (!) 145/81 119/81 (!) 102/47   Pulse: 80 74 69 71  Resp: 18 16 18 16   Temp: 98 F (36.7 C) 97.9 F (36.6 C) 97.9 F (36.6 C) 97.9 F (36.6 C)  TempSrc:  Oral Oral Oral  SpO2: 97% 97% 97% 94%  Weight:   98 kg   Height:   5\' 3"  (1.6 m)     Intake/Output Summary (Last 24 hours) at 10/01/2021 0805 Last data filed at 10/01/2021 12/01/2021 Gross per 24 hour  Intake 948.02 ml  Output --  Net 948.02 ml   Filed Weights   10/01/21 0113  Weight: 98 kg    Examination: Calm, NAD Cta no w/r Reg s1/s2 no gallop Soft +bs, ttp abd. No edema Aaoxox3  Mood and affect appropriate in current setting     Data Reviewed: I have personally reviewed following labs and imaging studies  CBC: Recent Labs  Lab 09/30/21 1601 10/01/21 0639  WBC 7.6 5.3  HGB 12.7 11.4*  HCT 38.9 34.8*  MCV 90.3 91.6  PLT 248 210   Basic Metabolic Panel: Recent Labs  Lab 09/30/21 1601 10/01/21 0639  NA 138 140  K 3.4* 3.8  CL 106 110  CO2 24 28  GLUCOSE 182* 98  BUN 15 13  CREATININE 0.55 0.51  CALCIUM 9.0 8.5*   GFR: Estimated Creatinine Clearance: 91.7 mL/min (by C-G formula based on SCr of 0.51 mg/dL). Liver Function Tests: Recent Labs  Lab 09/30/21 1601  AST 25  ALT 29  ALKPHOS 70  BILITOT 0.4  PROT 7.4  ALBUMIN 3.9   Recent Labs  Lab 09/30/21 1601  LIPASE 27   No results for input(s): "AMMONIA"  in the last 168 hours. Coagulation Profile: No results for input(s): "INR", "PROTIME" in the last 168 hours. Cardiac Enzymes: No results for input(s): "CKTOTAL", "CKMB", "CKMBINDEX", "TROPONINI" in the last 168 hours. BNP (last 3 results) No results for input(s): "PROBNP" in the last 8760 hours. HbA1C: No results for input(s): "HGBA1C" in the last 72 hours. CBG: No results for input(s): "GLUCAP" in the last 168 hours. Lipid Profile: No results for input(s): "CHOL", "HDL", "LDLCALC", "TRIG", "CHOLHDL", "LDLDIRECT" in the last 72 hours. Thyroid Function Tests: No results for input(s): "TSH", "T4TOTAL", "FREET4",  "T3FREE", "THYROIDAB" in the last 72 hours. Anemia Panel: No results for input(s): "VITAMINB12", "FOLATE", "FERRITIN", "TIBC", "IRON", "RETICCTPCT" in the last 72 hours. Sepsis Labs: No results for input(s): "PROCALCITON", "LATICACIDVEN" in the last 168 hours.  No results found for this or any previous visit (from the past 240 hour(s)).       Radiology Studies: CT ABDOMEN PELVIS W CONTRAST  Result Date: 09/30/2021 CLINICAL DATA:  Abdominal pain and nausea. EXAM: CT ABDOMEN AND PELVIS WITH CONTRAST TECHNIQUE: Multidetector CT imaging of the abdomen and pelvis was performed using the standard protocol following bolus administration of intravenous contrast. RADIATION DOSE REDUCTION: This exam was performed according to the departmental dose-optimization program which includes automated exposure control, adjustment of the mA and/or kV according to patient size and/or use of iterative reconstruction technique. CONTRAST:  OMNIPAQUE IOHEXOL 300 MG/ML  SOLN COMPARISON:  CT of the abdomen and pelvis 02/24/2018 FINDINGS: Lower chest: No acute abnormality. Hepatobiliary: No focal liver abnormality is seen. Status post cholecystectomy. No biliary dilatation. Pancreas: Unremarkable. No pancreatic ductal dilatation or surrounding inflammatory changes. Spleen: Normal in size without focal abnormality. Adrenals/Urinary Tract: Adrenal glands are unremarkable. Kidneys are normal, without renal calculi, focal lesion, or hydronephrosis. Bladder is unremarkable. Stomach/Bowel: Acute inflammatory process is identified associated with a segment of anterior small bowel in the right mid abdomen just deep to the abdominal wall and roughly at the level of the distal jejunum/proximal ileum. Significant inflammation is noted in the surrounding mesenteric fat with an associated bilobed shaped walled-off area measuring roughly 2.5 x 1.8 x 2.7 cm. Within this area is complex fat density without liquid or air. Findings are  suggestive of potentially microperforation of small bowel leading to an area of inflammation and fat necrosis. No evidence of bowel obstruction, ileus or free intraperitoneal air. Vascular/Lymphatic: Mild atherosclerosis of the abdominal aorta and common iliac arteries without aneurysm. No lymphadenopathy identified in the abdomen or pelvis. Reproductive: Status post hysterectomy. No adnexal masses. Other: No abdominal wall hernia or abnormality. No abdominopelvic ascites. Musculoskeletal: No acute or significant osseous findings. IMPRESSION: Acute inflammatory process associated with a segment of small bowel in the anterior mid right peritoneal cavity at the level of distal jejunum/proximal ileum. There is regional inflammation in the mesenteric fat with an associated bilobed area that appears more walled-off but is internally of fat density. This is suggestive of potentially microperforation of small bowel leading to an area of inflammation and fat necrosis. There is no liquefied abscess at this time or evidence of extraluminal air. Electronically Signed   By: Irish Lack M.D.   On: 09/30/2021 18:28        Scheduled Meds:  enoxaparin (LOVENOX) injection  0.5 mg/kg Subcutaneous Q24H   pantoprazole (PROTONIX) IV  40 mg Intravenous Q24H   Continuous Infusions:  ceFEPime (MAXIPIME) IV Stopped (10/01/21 1914)   lactated ringers 125 mL/hr at 09/30/21 2246   metronidazole Stopped (09/30/21 2352)  Assessment & Plan:   Principal Problem:   Small bowel microperforation (HCC) Active Problems:   Hypertension   Gastroesophageal reflux disease   Small bowel microperforation (HCC) General surgery following , recommend conservative therapy Cefepime and metronidazole was switched to Zosyn as patient had no improvement WBC down, afebrile Continue bowel rest, IV hydration Encourage patient to ambulate If there is no improvement in the next 24 hours we will consider repeat CT Oral  contrast   Gastroesophageal reflux disease Iv ppx   Hypertension Iv hydralazine   DVT prophylaxis: Zosyn Code Status: Full Family Communication: None at bedside Disposition Plan:  Status is: Inpatient Remains inpatient appropriate because: IV treatment        LOS: 1 day   Time spent: 35 minutes    Lynn ItoSahar Weslyn Holsonback, MD [facility] Pager [phone]  If 7PM-7AM, please contact night-coverage 10/01/2021, 8:05 AM

## 2021-10-01 NOTE — Care Management (Signed)
  Transition of Care [facility]) Screening Note   Patient Details  Name: Caroline Turner Date of Birth: [birth year]   Transition of Care [facility]) CM/SW Contact:    Caryn Section, RN Phone Number: 10/01/2021, 11:38 AM    Transition of Care Department [facility]) has reviewed patient and no TOC needs have been identified at this time. We will continue to monitor patient advancement through interdisciplinary progression rounds. If new patient transition needs arise, please place a TOC consult.

## 2021-10-01 NOTE — Progress Notes (Signed)
Anticoagulation monitoring(Lovenox):  52 yo  female ordered Lovenox 40 mg Q24h    Filed Weights   10/01/21 0113  Weight: 98 kg (216 lb 0.8 oz)   BMI 38.27   Lab Results  Component Value Date   CREATININE 0.55 09/30/2021   CREATININE 0.53 06/02/2021   CREATININE 0.47 12/04/2019   Estimated Creatinine Clearance: 91.7 mL/min (by C-G formula based on SCr of 0.55 mg/dL). Hemoglobin & Hematocrit     Component Value Date/Time   HGB 12.7 09/30/2021 1601   HCT 38.9 09/30/2021 1601     Per Protocol for Patient with estCrcl > 30 ml/min and BMI > 30, will transition to Lovenox 50 mg Q24h.

## 2021-10-01 NOTE — Progress Notes (Signed)
   10/01/21 1030  Clinical Encounter Type  Visited With Patient not available   Chaplain Burris attempted to visit pt this a.m. to discuss AD. Pt resting at this time. Will notify spiritual care team for f/u.

## 2021-10-01 NOTE — Progress Notes (Signed)
Patient ID: Caroline Turner, female   DOB: [birth year], 51 y.o.   MRN: [id number]     SURGICAL PROGRESS NOTE   Hospital Day(s): 1.   Interval History: Patient seen and examined, no acute events or new complaints overnight. Patient reports continued having paramedical pain.  There was also some upper epigastric pain.  Denies any nausea or vomiting.  Denies any fever or chills.  Pain aggravated by applying pressure.  There is no alleviating factor.  No other pain radiation.  Vital signs in last 24 hours: [min-max] current  Temp:  [97.9 F (36.6 C)-98.4 F (36.9 C)] 98.4 F (36.9 C) (06/08 0807) Pulse Rate:  [69-80] 70 (06/08 0807) Resp:  [16-18] 16 (06/08 0807) BP: (102-157)/(47-91) 119/62 (06/08 0807) SpO2:  [94 %-97 %] 95 % (06/08 0807) Weight:  [98 kg] 98 kg (06/08 0113)     Height: 5\' 3"  (160 cm) Weight: 98 kg BMI (Calculated): 38.28   Physical Exam:  Constitutional: alert, cooperative and no distress  Respiratory: breathing non-labored at rest  Cardiovascular: regular rate and sinus rhythm  Gastrointestinal: soft, tender right upper abdomen, and non-distended  Labs:     Latest Ref Rng & Units 10/01/2021    6:39 AM 09/30/2021    4:01 PM 07/03/2019    9:33 AM  CBC  WBC 4.0 - 10.5 K/uL 5.3  7.6  4.9   Hemoglobin 12.0 - 15.0 g/dL 09/02/2019  13.0  86.5   Hematocrit 36.0 - 46.0 % 34.8  38.9  40.2   Platelets 150 - 400 K/uL 210  248  260       Latest Ref Rng & Units 10/01/2021    6:39 AM 09/30/2021    4:01 PM 06/02/2021    9:23 AM  CMP  Glucose 70 - 99 mg/dL 98  07/31/2021  93   BUN 6 - 20 mg/dL 13  15  13    Creatinine 0.44 - 1.00 mg/dL 696   2.95   Sodium 135 - 145 mmol/L 140  138  139   Potassium 3.5 - 5.1 mmol/L 3.8  3.4  3.9   Chloride 98 - 111 mmol/L 110  106  102   CO2 22 - 32 mmol/L 28  24  29    Calcium 8.9 - 10.3 mg/dL 8.5  9.0  9.5   Total Protein 6.5 - 8.1 g/dL  7.4    Total Bilirubin 0.3 - 1.2 mg/dL  0.4    Alkaline Phos 38 - 126 U/L  70    AST 15 - 41 U/L  25    ALT 0 - 44  U/L  29      Imaging studies: No new pertinent imaging studies   Assessment/Plan:  51 y.o. female with inflammatory process of the small intestine.  Suspected microperforation versus localized enteritis.  The fact that there is no air or fluid in the wall of the inflammatory process is atypical of perforation but on the right side local enteritis usually does not form these external wall of inflammatory processes.  -Recommend to continue IV antibiotic therapy.  I changed the IV antibiotic therapy to Zosyn since there has been no improvement with cefepime and metronidazole. -No increase of white blood cell count.  No fever. -Persistent pain without significant improvement or deterioration -Continue bowel rest -Continue IV hydration -Encourage the patient to ambulate -If there is no improvement in the next 24 hours we will consider repeating CT scan with oral contrast.  I will follow closely.  Arnold Long, MD

## 2021-10-01 NOTE — Telephone Encounter (Signed)
Will discuss at upcoming appt.

## 2021-10-02 ENCOUNTER — Encounter: Payer: Self-pay | Admitting: Internal Medicine

## 2021-10-02 MED ORDER — AMLODIPINE BESYLATE 5 MG PO TABS
5.0000 mg | ORAL_TABLET | Freq: Every day | ORAL | Status: DC
  Administered 2021-10-02 – 2021-10-03 (×2): 5 mg via ORAL
  Filled 2021-10-02 (×2): qty 1

## 2021-10-02 MED ORDER — LIDOCAINE 5 % EX PTCH
1.0000 | MEDICATED_PATCH | CUTANEOUS | Status: DC
  Administered 2021-10-02: 1 via TRANSDERMAL
  Filled 2021-10-02 (×2): qty 1

## 2021-10-02 NOTE — Progress Notes (Signed)
Patient ID: Caroline Turner, female   DOB: Mar 19, 1971, 51 y.o.   MRN: [id number]     SURGICAL PROGRESS NOTE   Hospital Day(s): 2.   Interval History: Patient seen and examined, no acute events or new complaints overnight. Patient reports she is feeling better.  She endorses that the pain is slowly improving.  She has not taken pain medication since last night.  Still with some soreness on the mid abdomen.  The right-sided abdominal pain is slowly improving.  Pain only exacerbated with palpation.  No pain radiation.  Aggravating factor has been pain medication antibiotic therapy.  Denies any nausea or vomiting.  Vital signs in last 24 hours: [min-max] current  Temp:  [97.7 F (36.5 C)-98.1 F (36.7 C)] 97.8 F (36.6 C) (06/09 0720) Pulse Rate:  [60-65] 64 (06/09 0720) Resp:  [16-20] 16 (06/09 0720) BP: (141-169)/(69-81) 169/69 (06/09 0720) SpO2:  [93 %-100 %] 100 % (06/09 0720)     Height: 5\' 3"  (160 cm) Weight: 98 kg BMI (Calculated): 38.28   Physical Exam:  Constitutional: alert, cooperative and no distress  Respiratory: breathing non-labored at rest  Cardiovascular: regular rate and sinus rhythm  Gastrointestinal: soft, mild tender on the mid and left abdomen (this is improved compared to yesterday), and non-distended  Labs:     Latest Ref Rng & Units 10/01/2021    6:39 AM 09/30/2021    4:01 PM 07/03/2019    9:33 AM  CBC  WBC 4.0 - 10.5 K/uL 5.3  7.6  4.9   Hemoglobin 12.0 - 15.0 g/dL 09/02/2019  38.1  01.7   Hematocrit 36.0 - 46.0 % 34.8  38.9  40.2   Platelets 150 - 400 K/uL 210  248  260       Latest Ref Rng & Units 10/01/2021    6:39 AM 09/30/2021    4:01 PM 06/02/2021    9:23 AM  CMP  Glucose 70 - 99 mg/dL 98  07/31/2021  93   BUN 6 - 20 mg/dL 13  15  13    Creatinine 0.44 - 1.00 mg/dL 258   5.27   Sodium 135 - 145 mmol/L 140  138  139   Potassium 3.5 - 5.1 mmol/L 3.8  3.4  3.9   Chloride 98 - 111 mmol/L 110  106  102   CO2 22 - 32 mmol/L 28  24  29    Calcium 8.9 - 10.3 mg/dL 8.5   9.0  9.5   Total Protein 6.5 - 8.1 g/dL  7.4    Total Bilirubin 0.3 - 1.2 mg/dL  0.4    Alkaline Phos 38 - 126 U/L  70    AST 15 - 41 U/L  25    ALT 0 - 44 U/L  29      Imaging studies: No new pertinent imaging studies   Assessment/Plan:  51 y.o. female with inflammatory process of the small intestine.  Suspected microperforation versus localized enteritis.  The fact that there is no air or fluid in the wall of the inflammatory process is atypical of perforation but on the right side local enteritis usually does not form these external wall of inflammatory processes.  -Patient with pain improved after antibiotic changed to Zosyn.  Continue Zosyn as antibiotic therapy. -Improve abdominal pain -We will start clear liquid diet and assess for toleration -Continue IV hydration -Encourage the patient to ambulate  4.23, MD

## 2021-10-02 NOTE — Progress Notes (Signed)
   10/02/21 0800  Clinical Encounter Type  Visited With Patient  Visit Type Initial;Other (Comment) (Advance Directive)   Chaplain provided information and explanation of Advance Directive.

## 2021-10-02 NOTE — Progress Notes (Signed)
PROGRESS NOTE    Caroline Turner  WNI:[id number] DOB: 1970-10-13 DOA: 09/30/2021 PCP: Lorre Munroe, NP    Brief Narrative:  Caroline Turner is a 51 y.o. female with medical history significant for Hypertension, hyperlipidemia, GERD and nephrolithiasis who presented with a 1 day history of periumbilical sharp abdominal pain associated with nausea without vomiting she denies dysuria, diarrhea or constipation.  Denies chest pain or shortness of breath, fever or chills.  Has prior cholecystectomy and partial hysterectomy. ED course and data review: BP 157/91 with otherwise normal vitals.  Labs including CBC and CMP for the most part unremarkable except for mild hypokalemia of 3.4.  Lipase normal at 27.  Urinalysis clean.  CT abdomen and pelvis with contrast shows the following findings: IMPRESSION: Acute inflammatory process associated with a segment of small bowel in the anterior mid right peritoneal cavity at the level of distal jejunum/proximal ileum. There is regional inflammation in the mesenteric fat with an associated bilobed area that appears more walled-off but is internally of fat density. This is suggestive of potentially microperforation of small bowel leading to an area of inflammation and fat necrosis. There is no liquefied abscess at this time or evidence of extraluminal air.     The ED provider spoke with on-call surgeon, Dr. Dr. Maia Plan about the concern for microperforation of the small bowel.  He recommended conservative treatment as no free air was seen and admission to the hospitalist service with surgery following.  Hospitalist thus consulted for admission.  Patient was started on cefepime and metronidazole   6/9 tolerated clear liquid diet asking if we could advance this no abdominal pain.  No nausea or vomiting.  Only abdominal pain if she walks and its mild.  Consultants:  surgery  Procedures:   Antimicrobials:  zosyn   Subjective: No chest pain or shortness  of breath  Objective: Vitals:   10/01/21 1628 10/01/21 1950 10/02/21 0609 10/02/21 0720  BP: (!) 141/74 (!) 157/81 (!) 150/78 (!) 169/69  Pulse: 64 65 60 64  Resp: 20 17 18 16   Temp: 98.1 F (36.7 C) 98 F (36.7 C) 97.7 F (36.5 C) 97.8 F (36.6 C)  TempSrc:      SpO2: 95% 97% 93% 100%  Weight:      Height:        Intake/Output Summary (Last 24 hours) at 10/02/2021 0759 Last data filed at 10/02/2021 0400 Gross per 24 hour  Intake 100 ml  Output 150 ml  Net -50 ml   Filed Weights   10/01/21 0113  Weight: 98 kg    Examination: Calm, NAD Cta no w/r Reg s1/s2 no gallop Soft benign +bs No edema Aaoxox3  Mood and affect appropriate in current setting     Data Reviewed: I have personally reviewed following labs and imaging studies  CBC: Recent Labs  Lab 09/30/21 1601 10/01/21 0639  WBC 7.6 5.3  HGB 12.7 11.4*  HCT 38.9 34.8*  MCV 90.3 91.6  PLT 248 210   Basic Metabolic Panel: Recent Labs  Lab 09/30/21 1601 10/01/21 0639  NA 138 140  K 3.4* 3.8  CL 106 110  CO2 24 28  GLUCOSE 182* 98  BUN 15 13  CREATININE 0.55 0.51  CALCIUM 9.0 8.5*   GFR: Estimated Creatinine Clearance: 91.7 mL/min (by C-G formula based on SCr of 0.51 mg/dL). Liver Function Tests: Recent Labs  Lab 09/30/21 1601  AST 25  ALT 29  ALKPHOS 70  BILITOT 0.4  PROT 7.4  ALBUMIN 3.9   Recent Labs  Lab 09/30/21 1601  LIPASE 27   No results for input(s): "AMMONIA" in the last 168 hours. Coagulation Profile: No results for input(s): "INR", "PROTIME" in the last 168 hours. Cardiac Enzymes: No results for input(s): "CKTOTAL", "CKMB", "CKMBINDEX", "TROPONINI" in the last 168 hours. BNP (last 3 results) No results for input(s): "PROBNP" in the last 8760 hours. HbA1C: No results for input(s): "HGBA1C" in the last 72 hours. CBG: No results for input(s): "GLUCAP" in the last 168 hours. Lipid Profile: No results for input(s): "CHOL", "HDL", "LDLCALC", "TRIG", "CHOLHDL",  "LDLDIRECT" in the last 72 hours. Thyroid Function Tests: No results for input(s): "TSH", "T4TOTAL", "FREET4", "T3FREE", "THYROIDAB" in the last 72 hours. Anemia Panel: No results for input(s): "VITAMINB12", "FOLATE", "FERRITIN", "TIBC", "IRON", "RETICCTPCT" in the last 72 hours. Sepsis Labs: No results for input(s): "PROCALCITON", "LATICACIDVEN" in the last 168 hours.  No results found for this or any previous visit (from the past 240 hour(s)).       Radiology Studies: CT ABDOMEN PELVIS W CONTRAST  Result Date: 09/30/2021 CLINICAL DATA:  Abdominal pain and nausea. EXAM: CT ABDOMEN AND PELVIS WITH CONTRAST TECHNIQUE: Multidetector CT imaging of the abdomen and pelvis was performed using the standard protocol following bolus administration of intravenous contrast. RADIATION DOSE REDUCTION: This exam was performed according to the departmental dose-optimization program which includes automated exposure control, adjustment of the mA and/or kV according to patient size and/or use of iterative reconstruction technique. CONTRAST:  OMNIPAQUE IOHEXOL 300 MG/ML  SOLN COMPARISON:  CT of the abdomen and pelvis 02/24/2018 FINDINGS: Lower chest: No acute abnormality. Hepatobiliary: No focal liver abnormality is seen. Status post cholecystectomy. No biliary dilatation. Pancreas: Unremarkable. No pancreatic ductal dilatation or surrounding inflammatory changes. Spleen: Normal in size without focal abnormality. Adrenals/Urinary Tract: Adrenal glands are unremarkable. Kidneys are normal, without renal calculi, focal lesion, or hydronephrosis. Bladder is unremarkable. Stomach/Bowel: Acute inflammatory process is identified associated with a segment of anterior small bowel in the right mid abdomen just deep to the abdominal wall and roughly at the level of the distal jejunum/proximal ileum. Significant inflammation is noted in the surrounding mesenteric fat with an associated bilobed shaped walled-off area  measuring roughly 2.5 x 1.8 x 2.7 cm. Within this area is complex fat density without liquid or air. Findings are suggestive of potentially microperforation of small bowel leading to an area of inflammation and fat necrosis. No evidence of bowel obstruction, ileus or free intraperitoneal air. Vascular/Lymphatic: Mild atherosclerosis of the abdominal aorta and common iliac arteries without aneurysm. No lymphadenopathy identified in the abdomen or pelvis. Reproductive: Status post hysterectomy. No adnexal masses. Other: No abdominal wall hernia or abnormality. No abdominopelvic ascites. Musculoskeletal: No acute or significant osseous findings. IMPRESSION: Acute inflammatory process associated with a segment of small bowel in the anterior mid right peritoneal cavity at the level of distal jejunum/proximal ileum. There is regional inflammation in the mesenteric fat with an associated bilobed area that appears more walled-off but is internally of fat density. This is suggestive of potentially microperforation of small bowel leading to an area of inflammation and fat necrosis. There is no liquefied abscess at this time or evidence of extraluminal air. Electronically Signed   By: Irish Lack M.D.   On: 09/30/2021 18:28        Scheduled Meds:  enoxaparin (LOVENOX) injection  0.5 mg/kg Subcutaneous Q24H   pantoprazole (PROTONIX) IV  40 mg Intravenous Q24H   Continuous Infusions:  piperacillin-tazobactam (ZOSYN)  IV 3.375 g (10/02/21 0529)    Assessment & Plan:   Principal Problem:   Small bowel microperforation (HCC) Active Problems:   Hypertension   Gastroesophageal reflux disease   Small bowel microperforation (HCC) General surgery following , recommend conservative therapy Cefepime and metronidazole was switched to Zosyn as patient had no improvement WBC down, afebrile Encourage patient to ambulate If there is no improvement in the next 24 hours we will consider repeat CT Oral  contrast 6/9 continue IV fluids Tolerated clears we will ask surgery if can advance to full liquid for today Continue IV Zosyn   Gastroesophageal reflux disease Continue PPx   Hypertension We will add amlodipine 5 mg daily   DVT prophylaxis: Zosyn Code Status: Full Family Communication: None at bedside Disposition Plan:  Status is: Inpatient Remains inpatient appropriate because: IV treatment        LOS: 2 days   Time spent: 35 minutes    Lynn ItoSahar Kyleigha Markert, MD [facility] Pager [phone]  If 7PM-7AM, please contact night-coverage 10/02/2021, 7:59 AM

## 2021-10-03 MED ORDER — PANTOPRAZOLE SODIUM 40 MG PO TBEC: 40.0000 mg | DELAYED_RELEASE_TABLET | Freq: Every day | ORAL | 0 refills | Status: DC

## 2021-10-03 MED ORDER — AMOXICILLIN-POT CLAVULANATE 875-125 MG PO TABS: 1.0000 | ORAL_TABLET | Freq: Two times a day (BID) | ORAL | 0 refills | Status: AC

## 2021-10-03 NOTE — Discharge Summary (Signed)
[name] MOQ:[id number] DOB: Dec 05, 1970 DOA: 09/30/2021  PCP: Lorre Munroe, NP  Admit date: 09/30/2021 Discharge date: 10/03/2021  Admitted From: Home Disposition: Home  Recommendations for Outpatient Follow-up:  Follow up with PCP in 1 week Please obtain BMP/CBC in one week Please follow up general surgery in 1 to 2 weeks     Discharge Condition:Stable CODE STATUS: Full Diet recommendation: Heart Healthy Brief/Interim Summary: Per PTW:SFKCL Caroline Turner is a 51 y.o. female with medical history significant for Hypertension, hyperlipidemia, GERD and nephrolithiasis who presented with a 1 day history of periumbilical sharp abdominal pain associated with nausea without vomiting she denies dysuria, diarrhea or constipation.  Denies chest pain or shortness of breath, fever or chills.  Has prior cholecystectomy and partial hysterectomy. ED course and data review: BP 157/91 with otherwise normal vitals.  Labs including CBC and CMP for the most part unremarkable except for mild hypokalemia of 3.4.  Lipase normal at 27.  Urinalysis clean.  CT abdomen and pelvis with contrast shows the following findings: IMPRESSION: Acute inflammatory process associated with a segment of small bowel in the anterior mid right peritoneal cavity at the level of distal jejunum/proximal ileum. There is regional inflammation in the mesenteric fat with an associated bilobed area that appears more walled-off but is internally of fat density. This is suggestive of potentially microperforation of small bowel leading to an area of inflammation and fat necrosis. There is no liquefied abscess at this time or evidence of extraluminal air.     The ED provider spoke with on-call surgeon, Dr. Dr. Maia Plan about the concern for microperforation of the small bowel.  He recommended conservative treatment as no free air was seen and admission to the hospitalist service with surgery following.  Hospitalist thus consulted for  admission.  Patient was started on cefepime and metronidazole and then switched to Zosyn as she was not improving.  She no longer has abdominal pain.  Tolerated her p.o. intake and being cleared for discharge.   Small bowel microperforation (HCC) General surgery following , recommend conservative therapy Cefepime and metronidazole was switched to Zosyn as patient had no improvement WBC down, afebrile Tolerated p.o. intake no further abdominal pain Cleared for discharge by general surgery  Discharge on Augmentin with follow-up with general surgery as outpatient    Gastroesophageal reflux disease Continue PPx   Hypertension Continue home meds    Discharge Diagnoses:  Principal Problem:   Small bowel microperforation (HCC) Active Problems:   Hypertension   Gastroesophageal reflux disease    Discharge Instructions  Discharge Instructions     Call MD for:  severe uncontrolled pain   Complete by: As directed    Diet - low sodium heart healthy   Complete by: As directed    Discharge instructions   Complete by: As directed    Advance your diet slowly next few days   Increase activity slowly   Complete by: As directed       Allergies as of 10/03/2021   No Known Allergies      Medication List     STOP taking these medications    azelastine 0.1 % nasal spray Commonly known as: ASTELIN   ibuprofen 600 MG tablet Commonly known as: ADVIL   meloxicam 15 MG tablet Commonly known as: Mobic   sulfamethoxazole-trimethoprim 800-160 MG tablet Commonly known as: Bactrim DS   traMADol 50 MG tablet Commonly known as: ULTRAM       TAKE these medications    acetaminophen  500 MG tablet Commonly known as: TYLENOL Take 1 tablet (500 mg total) by mouth every 6 (six) hours as needed. For use AFTER surgery   amoxicillin-clavulanate 875-125 MG tablet Commonly known as: AUGMENTIN Take 1 tablet by mouth 2 (two) times daily for 7 days.   fenofibrate 54 MG tablet Take 1  tablet (54 mg total) by mouth daily.   hydrochlorothiazide 25 MG tablet Commonly known as: HYDRODIURIL TAKE 1 TABLET BY MOUTH DAILY   pantoprazole 40 MG tablet Commonly known as: Protonix Take 1 tablet (40 mg total) by mouth daily.   vitamin B-12 100 MCG tablet Commonly known as: CYANOCOBALAMIN Take 100 mcg by mouth daily.   Vitamin D2 50 MCG (2000 UT) Tabs Take by mouth.        Follow-up Information     Carolan Shiverintron-Diaz, Edgardo, MD Follow up in 2 week(s).   Specialty: General Surgery Why: Follow up after enteritis Contact information: [address] [place] [address] [phone]                No Known Allergies  Consultations: General surgery   Procedures/Studies: CT ABDOMEN PELVIS W CONTRAST  Result Date: 09/30/2021 CLINICAL DATA:  Abdominal pain and nausea. EXAM: CT ABDOMEN AND PELVIS WITH CONTRAST TECHNIQUE: Multidetector CT imaging of the abdomen and pelvis was performed using the standard protocol following bolus administration of intravenous contrast. RADIATION DOSE REDUCTION: This exam was performed according to the departmental dose-optimization program which includes automated exposure control, adjustment of the mA and/or kV according to patient size and/or use of iterative reconstruction technique. CONTRAST:  100mL OMNIPAQUE IOHEXOL 300 MG/ML  SOLN COMPARISON:  CT of the abdomen and pelvis 02/24/2018 FINDINGS: Lower chest: No acute abnormality. Hepatobiliary: No focal liver abnormality is seen. Status post cholecystectomy. No biliary dilatation. Pancreas: Unremarkable. No pancreatic ductal dilatation or surrounding inflammatory changes. Spleen: Normal in size without focal abnormality. Adrenals/Urinary Tract: Adrenal glands are unremarkable. Kidneys are normal, without renal calculi, focal lesion, or hydronephrosis. Bladder is unremarkable. Stomach/Bowel: Acute inflammatory process is identified associated with a segment of anterior small bowel in  the right mid abdomen just deep to the abdominal wall and roughly at the level of the distal jejunum/proximal ileum. Significant inflammation is noted in the surrounding mesenteric fat with an associated bilobed shaped walled-off area measuring roughly 2.5 x 1.8 x 2.7 cm. Within this area is complex fat density without liquid or air. Findings are suggestive of potentially microperforation of small bowel leading to an area of inflammation and fat necrosis. No evidence of bowel obstruction, ileus or free intraperitoneal air. Vascular/Lymphatic: Mild atherosclerosis of the abdominal aorta and common iliac arteries without aneurysm. No lymphadenopathy identified in the abdomen or pelvis. Reproductive: Status post hysterectomy. No adnexal masses. Other: No abdominal wall hernia or abnormality. No abdominopelvic ascites. Musculoskeletal: No acute or significant osseous findings. IMPRESSION: Acute inflammatory process associated with a segment of small bowel in the anterior mid right peritoneal cavity at the level of distal jejunum/proximal ileum. There is regional inflammation in the mesenteric fat with an associated bilobed area that appears more walled-off but is internally of fat density. This is suggestive of potentially microperforation of small bowel leading to an area of inflammation and fat necrosis. There is no liquefied abscess at this time or evidence of extraluminal air. Electronically Signed   By: Irish LackGlenn  Yamagata M.D.   On: 09/30/2021 18:28      Subjective:   Discharge Exam: Vitals:   10/02/21 2109 10/03/21 0745  BP: Marland Kitchen(!)  157/88 (!) 145/97  Pulse: 71 69  Resp: 18 16  Temp: 98.1 F (36.7 C) 98.4 F (36.9 C)  SpO2: 98% 99%   Vitals:   10/02/21 0720 10/02/21 1732 10/02/21 2109 10/03/21 0745  BP: (!) 169/69 (!) 144/74 (!) 157/88 (!) 145/97  Pulse: 64 (!) 59 71 69  Resp: 16 16 18 16   Temp: 97.8 F (36.6 C) 98 F (36.7 C) 98.1 F (36.7 C) 98.4 F (36.9 C)  TempSrc:  Oral    SpO2: 100%  98% 98% 99%  Weight:      Height:        General: Pt is alert, awake, not in acute distress Cardiovascular: RRR, S1/S2 +, no rubs, no gallops Respiratory: CTA bilaterally, no wheezing, no rhonchi Abdominal: Soft, NT, ND, bowel sounds + Extremities: no edema, no cyanosis    The results of significant diagnostics from this hospitalization (including imaging, microbiology, ancillary and laboratory) are listed below for reference.     Microbiology: No results found for this or any previous visit (from the past 240 hour(s)).   Labs: BNP (last 3 results) No results for input(s): "BNP" in the last 8760 hours. Basic Metabolic Panel: Recent Labs  Lab 09/30/21 1601 10/01/21 0639  NA 138 140  K 3.4* 3.8  CL 106 110  CO2 24 28  GLUCOSE 182* 98  BUN 15 13  CREATININE 0.55 0.51  CALCIUM 9.0 8.5*   Liver Function Tests: Recent Labs  Lab 09/30/21 1601  AST 25  ALT 29  ALKPHOS 70  BILITOT 0.4  PROT 7.4  ALBUMIN 3.9   Recent Labs  Lab 09/30/21 1601  LIPASE 27   No results for input(s): "AMMONIA" in the last 168 hours. CBC: Recent Labs  Lab 09/30/21 1601 10/01/21 0639  WBC 7.6 5.3  HGB 12.7 11.4*  HCT 38.9 34.8*  MCV 90.3 91.6  PLT 248 210   Cardiac Enzymes: No results for input(s): "CKTOTAL", "CKMB", "CKMBINDEX", "TROPONINI" in the last 168 hours. BNP: Invalid input(s): "POCBNP" CBG: No results for input(s): "GLUCAP" in the last 168 hours. D-Dimer No results for input(s): "DDIMER" in the last 72 hours. Hgb A1c No results for input(s): "HGBA1C" in the last 72 hours. Lipid Profile No results for input(s): "CHOL", "HDL", "LDLCALC", "TRIG", "CHOLHDL", "LDLDIRECT" in the last 72 hours. Thyroid function studies No results for input(s): "TSH", "T4TOTAL", "T3FREE", "THYROIDAB" in the last 72 hours.  Invalid input(s): "FREET3" Anemia work up No results for input(s): "VITAMINB12", "FOLATE", "FERRITIN", "TIBC", "IRON", "RETICCTPCT" in the last 72  hours. Urinalysis    Component Value Date/Time   COLORURINE YELLOW (A) 09/30/2021 1601   APPEARANCEUR HAZY (A) 09/30/2021 1601   APPEARANCEUR Clear 02/24/2018 1248   LABSPEC 1.026 09/30/2021 1601   PHURINE 5.0 09/30/2021 1601   GLUCOSEU NEGATIVE 09/30/2021 1601   HGBUR NEGATIVE 09/30/2021 1601   BILIRUBINUR NEGATIVE 09/30/2021 1601   BILIRUBINUR Negative 11/26/2020 1605   BILIRUBINUR Negative 02/24/2018 1248   KETONESUR NEGATIVE 09/30/2021 1601   PROTEINUR NEGATIVE 09/30/2021 1601   UROBILINOGEN 1.0 11/26/2020 1605   NITRITE NEGATIVE 09/30/2021 1601   LEUKOCYTESUR NEGATIVE 09/30/2021 1601   Sepsis Labs Recent Labs  Lab 09/30/21 1601 10/01/21 0639  WBC 7.6 5.3   Microbiology No results found for this or any previous visit (from the past 240 hour(s)).   Time coordinating discharge: Over 30 minutes  SIGNED:   12/01/21, MD  [facility] 10/03/2021, 1:00 PM Pager   If 7PM-7AM, please contact night-coverage [email or web address] Password [email or web address]

## 2021-10-03 NOTE — Progress Notes (Signed)
Patient ID: Caroline Turner, female   DOB: [birth year], 51 y.o.   MRN: [id number]     [facility] Day(s): 3.   Interval History: Patient seen and examined, no acute events or new complaints overnight. Patient reports having no abdominal pain.  Patient endorses that the pain has completely resolved.  She denies any nausea or vomiting.  She tolerated full liquid diet.  Endorses passing gas and having bowel movement.  Vital signs in last 24 hours: [min-max] current  Temp:  [98 F (36.7 C)-98.4 F (36.9 C)] 98.4 F (36.9 C) (06/10 0745) Pulse Rate:  [59-71] 69 (06/10 0745) Resp:  [16-18] 16 (06/10 0745) BP: (144-157)/(74-97) 145/97 (06/10 0745) SpO2:  [98 %-99 %] 99 % (06/10 0745)     Height: 5\' 3"  (160 cm) Weight: 98 kg BMI (Calculated): 38.28   Physical Exam:  Constitutional: alert, cooperative and no distress  Respiratory: breathing non-labored at rest  Cardiovascular: regular rate and sinus rhythm  Gastrointestinal: soft, non-tender, and non-distended  Labs:     Latest Ref Rng & Units 10/01/2021    6:39 AM 09/30/2021    4:01 PM 07/03/2019    9:33 AM  CBC  WBC 4.0 - 10.5 K/uL 5.3  7.6  4.9   Hemoglobin 12.0 - 15.0 g/dL 11.4  12.7  13.1   Hematocrit 36.0 - 46.0 % 34.8  38.9  40.2   Platelets 150 - 400 K/uL 210  248  260       Latest Ref Rng & Units 10/01/2021    6:39 AM 09/30/2021    4:01 PM 06/02/2021    9:23 AM  CMP  Glucose 70 - 99 mg/dL 98  182  93   BUN 6 - 20 mg/dL 13  15  13    Creatinine 0.44 - 1.00 mg/dL 0.51  0.55  0.53   Sodium 135 - 145 mmol/L 140  138  139   Potassium 3.5 - 5.1 mmol/L 3.8  3.4  3.9   Chloride 98 - 111 mmol/L 110  106  102   CO2 22 - 32 mmol/L 28  24  29    Calcium 8.9 - 10.3 mg/dL 8.5  9.0  9.5   Total Protein 6.5 - 8.1 g/dL  7.4    Total Bilirubin 0.3 - 1.2 mg/dL  0.4    Alkaline Phos 38 - 126 U/L  70    AST 15 - 41 U/L  25    ALT 0 - 44 U/L  29      Imaging studies: No new pertinent imaging studies   Assessment/Plan:  51  y.o. female with inflammatory process of the small intestine.  Suspected microperforation versus localized enteritis.  The fact that there is no air or fluid in the wall of the inflammatory process is atypical of perforation but on the right side local enteritis usually does not form these external wall of inflammatory processes.  This is to be resolved.  The patient has 0 pain.  There is no vital cell count.  Patient tolerated full liquids.  I discussed with the patient to continue full liquid diet for 2 or 3 days and then advance diet to soft diet.  I will follow her in 2 weeks to coordinate outpatient CT scan for evaluation of resolution of the inflammatory process.  I will complete 10 days of antibiotic therapy.  Arnold Long, MD

## 2021-10-03 NOTE — Progress Notes (Signed)
Patient is being discharged home. Discharge papers given and explained to patient. She verbalized understanding. Meds and f/u appointment reviewed. Rx was sent electronically to the pharmacy. Patient made aware.

## 2021-10-05 ENCOUNTER — Telehealth: Payer: Self-pay

## 2021-10-05 NOTE — Telephone Encounter (Signed)
LVM to return call and make appt w/ Nicki Reaper for 1 week hosp f/u

## 2021-10-06 ENCOUNTER — Telehealth: Payer: Self-pay

## 2021-10-06 NOTE — Telephone Encounter (Signed)
2nd attempt- LVM to make appt w/ Rene Kocher

## 2021-10-12 ENCOUNTER — Other Ambulatory Visit: Payer: Self-pay

## 2021-10-12 ENCOUNTER — Other Ambulatory Visit: Payer: Self-pay | Admitting: Internal Medicine

## 2021-10-12 DIAGNOSIS — I10 Essential (primary) hypertension: Secondary | ICD-10-CM

## 2021-10-12 MED ORDER — FENOFIBRATE 54 MG PO TABS
54.0000 mg | ORAL_TABLET | Freq: Every day | ORAL | 0 refills | Status: DC
  Filled 2021-10-12: qty 90, 90d supply, fill #0

## 2021-10-12 MED ORDER — HYDROCHLOROTHIAZIDE 25 MG PO TABS
ORAL_TABLET | Freq: Every day | ORAL | 0 refills | Status: DC
  Filled 2021-10-12: qty 90, 90d supply, fill #0

## 2021-10-12 NOTE — Telephone Encounter (Signed)
Requested Prescriptions  Pending Prescriptions Disp Refills  . hydrochlorothiazide (HYDRODIURIL) 25 MG tablet 90 tablet 0    Sig: TAKE 1 TABLET BY MOUTH DAILY     Cardiovascular: Diuretics - Thiazide Failed - 10/12/2021 10:40 AM      Failed - Last BP in normal range    BP Readings from Last 1 Encounters:  10/03/21 (!) 145/97         Passed - Cr in normal range and within 180 days    Creat  Date Value Ref Range Status  06/02/2021 0.53 0.50 - 1.03 mg/dL Final   Creatinine, Ser  Date Value Ref Range Status  10/01/2021 0.51 0.44 - 1.00 mg/dL Final         Passed - K in normal range and within 180 days    Potassium  Date Value Ref Range Status  10/01/2021 3.8 3.5 - 5.1 mmol/L Final         Passed - Na in normal range and within 180 days    Sodium  Date Value Ref Range Status  10/01/2021 140 135 - 145 mmol/L Final    Comment:    ELECTROLYTES REPEATED TO VERIFY DAS         Passed - Valid encounter within last 6 months    Recent Outpatient Visits          4 months ago Irregular heart rhythm   [place], Coralie Keens, NP   10 months ago Acute cystitis with hematuria   [place], DO   1 year ago Nasal congestion   [facility], Lupita Raider, FNP   1 year ago Annual physical exam   [facility], Lupita Raider, FNP   2 years ago Vitamin D deficiency   [facility], Lupita Raider, [place]             . fenofibrate 54 MG tablet 90 tablet 0    Sig: Take 1 tablet (54 mg total) by mouth daily.     Cardiovascular:  Antilipid - Fibric Acid Derivatives Failed - 10/12/2021 10:40 AM      Failed - HGB in normal range and within 360 days    Hemoglobin  Date Value Ref Range Status  10/01/2021 11.4 (L) 12.0 - 15.0 g/dL Final         Failed - HCT in normal range and within 360 days    HCT  Date Value Ref Range Status  10/01/2021 34.8 (L) 36.0 - 46.0 % Final          Failed - Lipid Panel in normal range within the last 12 months    Cholesterol  Date Value Ref Range Status  06/02/2021 204 (H) <200 mg/dL Final   LDL Cholesterol (Calc)  Date Value Ref Range Status  06/02/2021 94 mg/dL (calc) Final    Comment:    Reference range: <100 . Desirable range <100 mg/dL for primary prevention;   <70 mg/dL for patients with CHD or diabetic patients  with > or = 2 CHD risk factors. Marland Kitchen LDL-C is now calculated using the Martin-Hopkins  calculation, which is a validated novel method providing  better accuracy than the Friedewald equation in the  estimation of LDL-C.  Cresenciano Genre et al. Annamaria Helling. 9390;300(92): 2061-2068  (http://education.[employer].com/faq/FAQ164)    HDL  Date Value Ref Range Status  06/02/2021 69 > OR = 50 mg/dL Final   Triglycerides  Date Value  Ref Range Status  06/02/2021 304 (H) <150 mg/dL Final    Comment:    . If a non-fasting specimen was collected, consider repeat triglyceride testing on a fasting specimen if clinically indicated.  Yates Decamp et al. J. of Clin. Lipidol. 4098;1:191-478. Marland Kitchen          Passed - ALT in normal range and within 360 days    ALT  Date Value Ref Range Status  09/30/2021 29 0 - 44 U/L Final         Passed - AST in normal range and within 360 days    AST  Date Value Ref Range Status  09/30/2021 25 15 - 41 U/L Final         Passed - Cr in normal range and within 360 days    Creat  Date Value Ref Range Status  06/02/2021 0.53 0.50 - 1.03 mg/dL Final   Creatinine, Ser  Date Value Ref Range Status  10/01/2021 0.51 0.44 - 1.00 mg/dL Final         Passed - PLT in normal range and within 360 days    Platelets  Date Value Ref Range Status  10/01/2021 210 150 - 400 K/uL Final         Passed - WBC in normal range and within 360 days    WBC  Date Value Ref Range Status  10/01/2021 5.3 4.0 - 10.5 K/uL Final         Passed - eGFR is 30 or above and within 360 days    GFR, Est African  American  Date Value Ref Range Status  07/03/2019 126 > OR = 60 mL/min/1.35m Final   GFR calc Af Amer  Date Value Ref Range Status  12/04/2019 >60 >60 mL/min Final   GFR, Est Non African American  Date Value Ref Range Status  07/03/2019 109 > OR = 60 mL/min/1.716mFinal   GFR, Estimated  Date Value Ref Range Status  10/01/2021 >60 >60 mL/min Final    Comment:    (NOTE) Calculated using the CKD-EPI Creatinine Equation (2021)          Passed - Valid encounter within last 12 months    Recent Outpatient Visits          4 months ago Irregular heart rhythm   [facility]   10 months ago [place] cystitis with [place]uria   [place]   1 year [facility]al congestion   [facility]   1 year ago Annual physical exam   [facility]   2 years ago Vitamin D deficiency   [facility] [place]

## 2021-10-13 ENCOUNTER — Other Ambulatory Visit: Payer: Self-pay

## 2021-10-20 ENCOUNTER — Other Ambulatory Visit: Payer: Self-pay | Admitting: General Surgery

## 2021-10-20 DIAGNOSIS — K631 Perforation of intestine (nontraumatic): Secondary | ICD-10-CM

## 2021-10-21 ENCOUNTER — Ambulatory Visit
Admission: RE | Admit: 2021-10-21 | Discharge: 2021-10-21 | Disposition: A | Discharge status: Home / Self Care | Payer: No Typology Code available for payment source | Source: Ambulatory Visit | Attending: General Surgery | Admitting: General Surgery

## 2021-10-21 DIAGNOSIS — K631 Perforation of intestine (nontraumatic): Secondary | ICD-10-CM | POA: Diagnosis present

## 2021-10-21 MED ORDER — IOHEXOL 300 MG/ML  SOLN
100.0000 mL | Freq: Once | INTRAMUSCULAR | Status: AC | PRN
  Administered 2021-10-21: 100 mL via INTRAVENOUS

## 2021-10-29 ENCOUNTER — Other Ambulatory Visit: Payer: Self-pay

## 2021-10-29 DIAGNOSIS — R1013 Epigastric pain: Secondary | ICD-10-CM

## 2021-10-29 DIAGNOSIS — Z1211 Encounter for screening for malignant neoplasm of colon: Secondary | ICD-10-CM

## 2021-10-29 MED ORDER — NA SULFATE-K SULFATE-MG SULF 17.5-3.13-1.6 GM/177ML PO SOLN
1.0000 | Freq: Once | ORAL | 0 refills | Status: AC
  Filled 2021-10-29: qty 354, fill #0
  Filled 2022-01-19: qty 354, 1d supply, fill #0

## 2022-01-13 ENCOUNTER — Encounter: Payer: Self-pay | Admitting: Gastroenterology

## 2022-01-13 ENCOUNTER — Telehealth: Payer: Self-pay | Admitting: Gastroenterology

## 2022-01-13 NOTE — Telephone Encounter (Signed)
Medical records for 01/13/2022 procedure were sent out  on 01/13/2022 to [facility]

## 2022-01-19 ENCOUNTER — Other Ambulatory Visit: Payer: Self-pay

## 2022-01-20 ENCOUNTER — Other Ambulatory Visit: Payer: Self-pay

## 2022-01-25 ENCOUNTER — Other Ambulatory Visit: Payer: Self-pay

## 2022-01-25 ENCOUNTER — Encounter
Admission: RE | Disposition: A | Discharge status: Home / Self Care | Payer: Self-pay | Source: Ambulatory Visit | Attending: Gastroenterology

## 2022-01-25 ENCOUNTER — Encounter: Payer: Self-pay | Admitting: Gastroenterology

## 2022-01-25 ENCOUNTER — Ambulatory Visit
Admission: RE | Admit: 2022-01-25 | Discharge: 2022-01-25 | Disposition: A | Discharge status: Home / Self Care | Payer: No Typology Code available for payment source | Source: Ambulatory Visit | Attending: Gastroenterology | Admitting: Gastroenterology

## 2022-01-25 ENCOUNTER — Ambulatory Visit: Payer: No Typology Code available for payment source | Admitting: Anesthesiology

## 2022-01-25 DIAGNOSIS — K2950 Unspecified chronic gastritis without bleeding: Secondary | ICD-10-CM | POA: Diagnosis not present

## 2022-01-25 DIAGNOSIS — K259 Gastric ulcer, unspecified as acute or chronic, without hemorrhage or perforation: Secondary | ICD-10-CM | POA: Diagnosis not present

## 2022-01-25 DIAGNOSIS — K219 Gastro-esophageal reflux disease without esophagitis: Secondary | ICD-10-CM | POA: Diagnosis not present

## 2022-01-25 DIAGNOSIS — B9681 Helicobacter pylori [H. pylori] as the cause of diseases classified elsewhere: Secondary | ICD-10-CM | POA: Diagnosis not present

## 2022-01-25 DIAGNOSIS — F17210 Nicotine dependence, cigarettes, uncomplicated: Secondary | ICD-10-CM | POA: Diagnosis not present

## 2022-01-25 DIAGNOSIS — I10 Essential (primary) hypertension: Secondary | ICD-10-CM | POA: Insufficient documentation

## 2022-01-25 DIAGNOSIS — K5730 Diverticulosis of large intestine without perforation or abscess without bleeding: Secondary | ICD-10-CM | POA: Diagnosis not present

## 2022-01-25 DIAGNOSIS — R1013 Epigastric pain: Secondary | ICD-10-CM | POA: Diagnosis not present

## 2022-01-25 DIAGNOSIS — K5732 Diverticulitis of large intestine without perforation or abscess without bleeding: Secondary | ICD-10-CM

## 2022-01-25 DIAGNOSIS — Z1211 Encounter for screening for malignant neoplasm of colon: Secondary | ICD-10-CM

## 2022-01-25 HISTORY — PX: ESOPHAGOGASTRODUODENOSCOPY: SHX5428

## 2022-01-25 HISTORY — PX: COLONOSCOPY: SHX5424

## 2022-01-25 HISTORY — DX: Cardiac murmur, unspecified: R01.1

## 2022-01-25 SURGERY — COLONOSCOPY
Anesthesia: General | Site: Throat

## 2022-01-25 MED ORDER — SODIUM CHLORIDE 0.9 % IV SOLN: INTRAVENOUS | Status: DC

## 2022-01-25 MED ORDER — LIDOCAINE HCL (CARDIAC) PF 100 MG/5ML IV SOSY
PREFILLED_SYRINGE | INTRAVENOUS | Status: DC | PRN
  Administered 2022-01-25: 60 mg via INTRAVENOUS

## 2022-01-25 MED ORDER — STERILE WATER FOR IRRIGATION IR SOLN
Status: DC | PRN
  Administered 2022-01-25: 200 mL

## 2022-01-25 MED ORDER — LACTATED RINGERS IV SOLN: INTRAVENOUS | Status: DC

## 2022-01-25 MED ORDER — PROPOFOL 10 MG/ML IV BOLUS
INTRAVENOUS | Status: DC | PRN
  Administered 2022-01-25: 20 mg via INTRAVENOUS
  Administered 2022-01-25: 40 mg via INTRAVENOUS
  Administered 2022-01-25: 60 mg via INTRAVENOUS
  Administered 2022-01-25 (×2): 30 mg via INTRAVENOUS
  Administered 2022-01-25: 60 mg via INTRAVENOUS
  Administered 2022-01-25: 30 mg via INTRAVENOUS

## 2022-01-25 SURGICAL SUPPLY — 8 items
BLOCK BITE 60FR ADLT L/F GRN (MISCELLANEOUS) ×2 IMPLANT
FORCEPS BIOP RAD 4 LRG CAP 4 (CUTTING FORCEPS) IMPLANT
GOWN CVR UNV OPN BCK APRN NK (MISCELLANEOUS) ×8 IMPLANT
GOWN ISOL THUMB LOOP REG UNIV (MISCELLANEOUS) ×8
KIT PRC NS LF DISP ENDO (KITS) ×4 IMPLANT
KIT PROCEDURE OLYMPUS (KITS) ×4
MANIFOLD NEPTUNE II (INSTRUMENTS) ×4 IMPLANT
WATER STERILE IRR 250ML POUR (IV SOLUTION) ×4 IMPLANT

## 2022-01-25 NOTE — Transfer of Care (Signed)
Immediate Anesthesia Transfer of Care Note  Patient: Caroline Turner  Procedure(s) Performed: COLONOSCOPY (Rectum) ESOPHAGOGASTRODUODENOSCOPY (EGD) (Throat)  Patient Location: PACU  Anesthesia Type: General  Level of Consciousness: awake, alert  and patient cooperative  Airway and Oxygen Therapy: Patient Spontanous Breathing and Patient connected to supplemental oxygen  Post-op Assessment: Post-op Vital signs reviewed, Patient's Cardiovascular Status Stable, Respiratory Function Stable, Patent Airway and No signs of Nausea or vomiting  Post-op Vital Signs: Reviewed and stable  Complications: No notable events documented.

## 2022-01-25 NOTE — H&P (Signed)
Lucilla Lame, MD [address] [place], [place] [address] Phone:[phone] Fax : [phone]  Primary Care Physician:  Jearld Fenton, NP Primary Gastroenterologist:  Dr. Allen Norris  Pre-Procedure History & Physical: HPI:  Caroline Turner is a 51 y.o. female is here for an endoscopy and colonoscopy.   Past Medical History:  Diagnosis Date   Heart murmur    pt reports she's had an echo to check this out & was found to be benign   History of swelling of feet    Hyperlipidemia    Hypertension    Kidney stones    PONV (postoperative nausea and vomiting)     Past Surgical History:  Procedure Laterality Date   BREAST REDUCTION SURGERY Bilateral 12/06/2019   Procedure: BREAST REDUCTION WITH LIPOSUCTION;  Surgeon: Wallace Going, DO;  Location: [facility];  Service: Plastics;  Laterality: Bilateral;  3.5 hours, please   CHOLECYSTECTOMY     NEPHROLITHOTOMY     PARTIAL HYSTERECTOMY     SHOULDER SURGERY Left    TRIGGER FINGER RELEASE Right 2023    Prior to Admission medications   Medication Sig Start Date End Date Taking? Authorizing Provider  acetaminophen (TYLENOL) 500 MG tablet Take 1 tablet (500 mg total) by mouth every 6 (six) hours as needed. For use AFTER surgery 12/06/19  Yes Young, Johanna C, PA-C  Ergocalciferol (VITAMIN D2) 50 MCG (2000 UT) TABS Take by mouth.    Yes [provider]  hydrochlorothiazide (HYDRODIURIL) 25 MG tablet TAKE 1 TABLET BY MOUTH DAILY 10/12/21 10/12/22 Yes Baity, Coralie Keens, NP  fenofibrate 54 MG tablet Take 1 tablet (54 mg total) by mouth daily. Patient not taking: Reported on 01/13/2022 10/12/21   Jearld Fenton, NP  pantoprazole (PROTONIX) 40 MG tablet Take 1 tablet (40 mg total) by mouth daily. Patient not taking: Reported on 01/25/2022 10/03/21 11/02/21  Nolberto Hanlon, MD  vitamin B-12 (CYANOCOBALAMIN) 100 MCG tablet Take 100 mcg by mouth daily. Patient not taking: Reported on 01/13/2022    [provider]    Allergies as of 10/29/2021   (No Known Allergies)    Family History  Problem Relation Age of Onset   Cancer Father        lung, liver, mets to brain   Dementia Father    Vascular Disease Mother    Dementia Mother        vascular dementia   Alzheimer's disease Mother     Social History   Socioeconomic History   Marital status: Married    Spouse name: Not on file   Number of children: 3   Years of education: Not on file   Highest education level: Master's degree (e.g., MA, MS, MEng, MEd, MSW, MBA)  Occupational History   Not on file  Tobacco Use   Smoking status: Some Days    Packs/day: 0.10    Types: Cigarettes   Smokeless tobacco: Never   Tobacco comments:    5 cigarettes a week  Vaping Use   Vaping Use: Never used  Substance and Sexual Activity   Alcohol use: Yes    Alcohol/week: 1.0 standard drink of alcohol    Types: 1 Cans of beer per week    Comment: occasionally   Drug use: Never   Sexual activity: Yes  Other Topics Concern   Not on file  Social History Narrative   Not on file   Social Determinants of Health   Financial Resource Strain: Not  on file  Food Insecurity: Not on file  Transportation Needs: Not on file  Physical Activity: Not on file  Stress: Not on file  Social Connections: Not on file  Intimate Partner Violence: Not on file    Review of Systems: See HPI, otherwise negative ROS  Physical Exam: BP (!) 145/87   Pulse 71   Temp 98.2 F (36.8 C) (Temporal)   Resp 16   Ht 5\' 3"  (1.6 m)   Wt 97.5 kg   SpO2 95%   BMI 38.09 kg/m  General:   Alert,  pleasant and cooperative in NAD Head:  Normocephalic and atraumatic. Neck:  Supple; no masses or thyromegaly. Lungs:  Clear throughout to auscultation.    Heart:  Regular rate and rhythm. Abdomen:  Soft, nontender and nondistended. Normal bowel sounds, without guarding, and without rebound.   Neurologic:  Alert and  oriented x4;  grossly normal  neurologically.  Impression/Plan: Caroline Turner is here for an endoscopy and colonoscopy to be performed for epigastric pain and recent diverticulitis  Risks, benefits, limitations, and alternatives regarding  endoscopy and colonoscopy have been reviewed with the patient.  Questions have been answered.  All parties agreeable.   Lucilla Lame, MD  01/25/2022, 7:14 AM

## 2022-01-25 NOTE — Op Note (Signed)
[facility] Patient Name: Caroline Turner Procedure Date: 01/25/2022 7:21 AM MRN: [id number] Account #: [id number] Date of Birth: [birth year] Admit Type: Outpatient Age: 51 Room: [facility] OR ROOM 01 Gender: Female Note Status: Finalized Instrument Name: [id number] Procedure:             Colonoscopy Indications:           Follow-up of diverticulitis Providers:             Lucilla Lame MD, MD Referring MD:          Jearld Fenton (Referring MD) Medicines:             Propofol per Anesthesia Complications:         No immediate complications. Procedure:             Pre-Anesthesia Assessment:                        - Prior to the procedure, a History and Physical was                         performed, and patient medications and allergies were                         reviewed. The patient's tolerance of previous                         anesthesia was also reviewed. The risks and benefits                         of the procedure and the sedation options and risks                         were discussed with the patient. All questions were                         answered, and informed consent was obtained. Prior                         Anticoagulants: The patient has taken no previous                         anticoagulant or antiplatelet agents. ASA Grade                         Assessment: II - A patient with mild systemic disease.                         After reviewing the risks and benefits, the patient                         was deemed in satisfactory condition to undergo the                         procedure.                        After obtaining informed consent, the colonoscope was  passed under direct vision. Throughout the procedure,                         the patient's blood pressure, pulse, and oxygen                         saturations were monitored continuously. The                         Colonoscope was introduced through  the anus and                         advanced to the the cecum, identified by appendiceal                         orifice and ileocecal valve. The colonoscopy was                         performed without difficulty. The patient tolerated                         the procedure well. The quality of the bowel                         preparation was excellent. Findings:      The perianal and digital rectal examinations were normal.      A few small-mouthed diverticula were found in the entire colon. Impression:            - Diverticulosis in the entire examined colon.                        - No specimens collected. Recommendation:        - Discharge patient to home.                        - Resume previous diet.                        - Continue present medications.                        - Repeat colonoscopy in 10 years for screening                         purposes. Procedure Code(s):     --- Professional ---                        [phone], Colonoscopy, flexible; diagnostic, including                         collection of specimen(s) by brushing or washing, when                         performed (separate procedure) Diagnosis Code(s):     --- Professional ---                        C16.60, Diverticulitis of large intestine without  perforation or abscess without bleeding CPT copyright 2019 American Medical Association. All rights reserved. The codes documented in this report are preliminary and upon coder review may  be revised to meet current compliance requirements. Midge Minium MD, MD 01/25/2022 7:51:03 AM This report has been signed electronically. Number of Addenda: 0 Note Initiated On: 01/25/2022 7:21 AM Scope Withdrawal Time: 0 hours 6 minutes 34 seconds  Total Procedure Duration: 0 hours 8 minutes 38 seconds  Estimated Blood Loss:  Estimated blood loss: none.      [facility]

## 2022-01-25 NOTE — Anesthesia Preprocedure Evaluation (Signed)
Anesthesia Evaluation  Patient identified by MRN, date of birth, ID band Patient awake    Reviewed: Allergy & Precautions, NPO status , Patient's Chart, lab work & pertinent test results  History of Anesthesia Complications (+) PONV and history of anesthetic complications  Airway Mallampati: III  TM Distance: <3 FB Neck ROM: full    Dental  (+) Chipped, Poor Dentition, Missing   Pulmonary neg shortness of breath, Current Smoker and Patient abstained from smoking.,    Pulmonary exam normal        Cardiovascular Exercise Tolerance: Good hypertension, (-) anginaNormal cardiovascular exam     Neuro/Psych negative neurological ROS  negative psych ROS   GI/Hepatic Neg liver ROS, GERD  Controlled,  Endo/Other  negative endocrine ROS  Renal/GU Renal disease  negative genitourinary   Musculoskeletal   Abdominal   Peds  Hematology negative hematology ROS (+)   Anesthesia Other Findings Past Medical History: No date: Heart murmur     Comment:  pt reports she's had an echo to check this out & was               found to be benign No date: History of swelling of feet No date: Hyperlipidemia No date: Hypertension No date: Kidney stones No date: PONV (postoperative nausea and vomiting)  Past Surgical History: 12/06/2019: BREAST REDUCTION SURGERY; Bilateral     Comment:  Procedure: BREAST REDUCTION WITH LIPOSUCTION;  Surgeon:               Wallace Going, DO;  Location: [facility];  Service: Plastics;  Laterality: Bilateral;  3.5               hours, please No date: CHOLECYSTECTOMY No date: NEPHROLITHOTOMY No date: PARTIAL HYSTERECTOMY No date: SHOULDER SURGERY; Left 2023: TRIGGER FINGER RELEASE; Right  BMI    Body Mass Index: 38.09 kg/m      Reproductive/Obstetrics negative OB ROS                             Anesthesia Physical Anesthesia Plan  ASA:  2  Anesthesia Plan: General   Post-op Pain Management:    Induction: Intravenous  PONV Risk Score and Plan: Propofol infusion and TIVA  Airway Management Planned: Natural Airway and Nasal Cannula  Additional Equipment:   Intra-op Plan:   Post-operative Plan:   Informed Consent: I have reviewed the patients History and Physical, chart, labs and discussed the procedure including the risks, benefits and alternatives for the proposed anesthesia with the patient or authorized representative who has indicated his/her understanding and acceptance.     Dental Advisory Given  Plan Discussed with: Anesthesiologist, CRNA and Surgeon  Anesthesia Plan Comments: (Patient consented for risks of anesthesia including but not limited to:  - adverse reactions to medications - risk of airway placement if required - damage to eyes, teeth, lips or other oral mucosa - nerve damage due to positioning  - sore throat or hoarseness - Damage to heart, brain, nerves, lungs, other parts of body or loss of life  Patient voiced understanding.)        Anesthesia Quick Evaluation

## 2022-01-25 NOTE — Op Note (Signed)
[facility] Patient Name: Caroline Turner Procedure Date: 01/25/2022 7:22 AM MRN: [id number] Account #: [id number] Date of Birth: Sep 09, 1970 Admit Type: Outpatient Age: 51 Room: [facility] OR ROOM 1 Gender: Female Note Status: Finalized Instrument Name: [id number] Procedure:             Upper GI endoscopy Indications:           Epigastric abdominal pain Providers:             Lucilla Lame MD, MD Referring MD:          Jearld Fenton (Referring MD) Medicines:             Propofol per Anesthesia Complications:         No immediate complications. Procedure:             Pre-Anesthesia Assessment:                        - Prior to the procedure, a History and Physical was                         performed, and patient medications and allergies were                         reviewed. The patient's tolerance of previous                         anesthesia was also reviewed. The risks and benefits                         of the procedure and the sedation options and risks                         were discussed with the patient. All questions were                         answered, and informed consent was obtained. Prior                         Anticoagulants: The patient has taken no previous                         anticoagulant or antiplatelet agents. ASA Grade                         Assessment: II - A patient with mild systemic disease.                         After reviewing the risks and benefits, the patient                         was deemed in satisfactory condition to undergo the                         procedure.                        After obtaining informed consent, the endoscope was  passed under direct vision. Throughout the procedure,                         the patient's blood pressure, pulse, and oxygen                         saturations were monitored continuously. The                         Colonoscope was introduced  through the mouth, and                         advanced to the second part of duodenum. The upper GI                         endoscopy was accomplished without difficulty. The                         patient tolerated the procedure well. Findings:      The examined esophagus was normal.      One non-bleeding cratered gastric ulcer with no stigmata of bleeding was       found in the gastric antrum. This was biopsied with a cold forceps for       histology.      The examined duodenum was normal. Impression:            - Normal esophagus.                        - Non-bleeding healing gastric ulcer with no stigmata                         of bleeding. Biopsied.                        - Normal examined duodenum. Recommendation:        - Discharge patient to home.                        - Resume previous diet.                        - Continue present medications.                        - Await pathology results.                        - Perform a colonoscopy today. Procedure Code(s):     --- Professional ---                        [phone], Esophagogastroduodenoscopy, flexible,                         transoral; with biopsy, single or multiple Diagnosis Code(s):     --- Professional ---                        R10.13, Epigastric pain  K25.9, Gastric ulcer, unspecified as acute or chronic,                         without hemorrhage or perforation CPT copyright 2019 American Medical Association. All rights reserved. The codes documented in this report are preliminary and upon coder review may  be revised to meet current compliance requirements. Lucilla Lame MD, MD 01/25/2022 7:38:20 AM This report has been signed electronically. Number of Addenda: 0 Note Initiated On: 01/25/2022 7:22 AM Total Procedure Duration: 0 hours 2 minutes 33 seconds  Estimated Blood Loss:  Estimated blood loss: none.      [facility]

## 2022-01-25 NOTE — Anesthesia Postprocedure Evaluation (Signed)
Anesthesia Post Note  Patient: Caroline Turner  Procedure(s) Performed: COLONOSCOPY (Rectum) ESOPHAGOGASTRODUODENOSCOPY (EGD) (Throat)  Patient location during evaluation: Endoscopy Anesthesia Type: General Level of consciousness: awake and alert Pain management: pain level controlled Vital Signs Assessment: post-procedure vital signs reviewed and stable Respiratory status: spontaneous breathing, nonlabored ventilation, respiratory function stable and patient connected to nasal cannula oxygen Cardiovascular status: blood pressure returned to baseline and stable Postop Assessment: no apparent nausea or vomiting Anesthetic complications: no   No notable events documented.   Last Vitals:  Vitals:   01/25/22 0753 01/25/22 0800  BP: 124/78 136/80  Pulse: 75 70  Resp: 14 15  Temp: (!) 36.3 C (!) 36.3 C  SpO2: 97% 96%    Last Pain:  Vitals:   01/25/22 0800  TempSrc:   PainSc: 0-No pain                 Precious Haws Lynise Porr

## 2022-01-26 ENCOUNTER — Encounter: Payer: Self-pay | Admitting: Gastroenterology

## 2022-01-26 LAB — SURGICAL PATHOLOGY

## 2022-01-27 ENCOUNTER — Other Ambulatory Visit: Payer: Self-pay

## 2022-01-27 MED ORDER — BISMUTH SUBSALICYLATE 262 MG/15ML PO SUSP
30.0000 mL | Freq: Four times a day (QID) | ORAL | 0 refills | Status: DC
  Filled 2022-01-27: qty 1680, 14d supply, fill #0

## 2022-01-27 MED ORDER — METRONIDAZOLE 500 MG PO TABS
500.0000 mg | ORAL_TABLET | Freq: Two times a day (BID) | ORAL | 0 refills | Status: DC
  Filled 2022-01-27: qty 28, 14d supply, fill #0

## 2022-01-27 MED ORDER — PANTOPRAZOLE SODIUM 20 MG PO TBEC
20.0000 mg | DELAYED_RELEASE_TABLET | Freq: Two times a day (BID) | ORAL | 0 refills | Status: DC
  Filled 2022-01-27 – 2022-01-28 (×2): qty 28, 14d supply, fill #0

## 2022-01-27 MED ORDER — TETRACYCLINE HCL 500 MG PO CAPS
500.0000 mg | ORAL_CAPSULE | Freq: Four times a day (QID) | ORAL | 0 refills | Status: DC
  Filled 2022-01-27 (×3): qty 56, 14d supply, fill #0

## 2022-01-28 ENCOUNTER — Telehealth: Payer: Self-pay

## 2022-01-28 ENCOUNTER — Other Ambulatory Visit: Payer: Self-pay

## 2022-01-28 MED ORDER — BISMUTH/METRONIDAZ/TETRACYCLIN 140-125-125 MG PO CAPS
1.0000 | ORAL_CAPSULE | Freq: Three times a day (TID) | ORAL | 0 refills | Status: DC
  Filled 2022-01-28: qty 120, 10d supply, fill #0

## 2022-01-28 NOTE — Telephone Encounter (Signed)
Received fax from pharmacy stating that the Tetracycline is not covered and they are requesting an alternative  Please advise

## 2022-01-28 NOTE — Addendum Note (Signed)
Addended by: Lurlean Nanny on: 01/28/2022 11:22 AM   Modules accepted: Orders

## 2022-01-28 NOTE — Telephone Encounter (Signed)
Pylera pack sent per VO from Dr Allen Norris... called pt lmovm

## 2022-01-28 NOTE — Addendum Note (Signed)
Addended by: Lurlean Nanny on: 01/28/2022 09:16 AM   Modules accepted: Orders

## 2022-02-24 ENCOUNTER — Ambulatory Visit (INDEPENDENT_AMBULATORY_CARE_PROVIDER_SITE_OTHER): Payer: No Typology Code available for payment source | Admitting: Internal Medicine

## 2022-02-24 ENCOUNTER — Encounter: Payer: Self-pay | Admitting: Internal Medicine

## 2022-02-24 VITALS — BP 128/84 | HR 77 | Temp 96.8°F | Ht 63.0 in | Wt 218.0 lb

## 2022-02-24 DIAGNOSIS — Z1159 Encounter for screening for other viral diseases: Secondary | ICD-10-CM

## 2022-02-24 DIAGNOSIS — R7309 Other abnormal glucose: Secondary | ICD-10-CM | POA: Diagnosis not present

## 2022-02-24 DIAGNOSIS — E538 Deficiency of other specified B group vitamins: Secondary | ICD-10-CM

## 2022-02-24 DIAGNOSIS — Z6838 Body mass index (BMI) 38.0-38.9, adult: Secondary | ICD-10-CM

## 2022-02-24 DIAGNOSIS — E559 Vitamin D deficiency, unspecified: Secondary | ICD-10-CM

## 2022-02-24 DIAGNOSIS — Z0001 Encounter for general adult medical examination with abnormal findings: Secondary | ICD-10-CM | POA: Diagnosis not present

## 2022-02-24 DIAGNOSIS — Z1231 Encounter for screening mammogram for malignant neoplasm of breast: Secondary | ICD-10-CM | POA: Diagnosis not present

## 2022-02-24 DIAGNOSIS — R5383 Other fatigue: Secondary | ICD-10-CM

## 2022-02-24 NOTE — Assessment & Plan Note (Signed)
Encourage diet and exercise for weight loss 

## 2022-02-24 NOTE — Patient Instructions (Signed)

## 2022-02-24 NOTE — Progress Notes (Signed)
Subjective:    Patient ID: Caroline Turner, female    DOB: 04/09/1971, 51 y.o.   MRN: [id number]  HPI  Patient presents to clinic today for her annual exam. She reports she has been feeling very fatigued since completing treatment for H Pylori. She has trouble falling asleep but is able to stay asleep. She has started napping during the day on the weekends. She reports lack of motivation. She is feeling a little depressed but not anxious.  Flu: 01/2022 Tetanus: 06/2018 COVID:  x 2 Shingrix: Never Pap smear: Hysterectomy Mammogram: 09/2020 Colon screening: 01/2022 Vision screening: as needed Dentist: as needed  Diet: She does eat meat. She consumes fruits and veggies. She tries to avoid fried foods. She drinks mostly water, soda, green tea Exercise: None  Review of Systems     Past Medical History:  Diagnosis Date   Heart murmur    pt reports she's had an echo to check this out & was found to be benign   History of swelling of feet    Hyperlipidemia    Hypertension    Kidney stones    PONV (postoperative nausea and vomiting)     Current Outpatient Medications  Medication Sig Dispense Refill   acetaminophen (TYLENOL) 500 MG tablet Take 1 tablet (500 mg total) by mouth every 6 (six) hours as needed. For use AFTER surgery 30 tablet 0   Bismuth/Metronidaz/Tetracyclin (PYLERA) 140-125-125 MG CAPS Take 3 capsules all together by mouth 4 (four) times daily - after meals and at bedtime. 120 capsule 0   Ergocalciferol (VITAMIN D2) 50 MCG (2000 UT) TABS Take by mouth.      fenofibrate 54 MG tablet Take 1 tablet (54 mg total) by mouth daily. (Patient not taking: Reported on 01/13/2022) 90 tablet 0   hydrochlorothiazide (HYDRODIURIL) 25 MG tablet TAKE 1 TABLET BY MOUTH DAILY 90 tablet 0   pantoprazole (PROTONIX) 20 MG tablet Take 1 tablet (20 mg total) by mouth 2 (two) times daily. 28 tablet 0   pantoprazole (PROTONIX) 40 MG tablet Take 1 tablet (40 mg total) by mouth daily. (Patient not  taking: Reported on 01/25/2022) 30 tablet 0   vitamin B-12 (CYANOCOBALAMIN) 100 MCG tablet Take 100 mcg by mouth daily. (Patient not taking: Reported on 01/13/2022)     No current facility-administered medications for this visit.    No Known Allergies  Family History  Problem Relation Age of Onset   Cancer Father        lung, liver, mets to brain   Dementia Father    Vascular Disease Mother    Dementia Mother        vascular dementia   Alzheimer's disease Mother     Social History   Socioeconomic History   Marital status: Married    Spouse name: Not on file   Number of children: 3   Years of education: Not on file   Highest education level: Master's degree (e.g., MA, MS, MEng, MEd, MSW, MBA)  Occupational History   Not on file  Tobacco Use   Smoking status: Some Days    Packs/day: 0.10    Types: Cigarettes   Smokeless tobacco: Never   Tobacco comments:    5 cigarettes a week  Vaping Use   Vaping Use: Never used  Substance and Sexual Activity   Alcohol use: Yes    Alcohol/week: 1.0 standard drink of alcohol    Types: 1 Cans of beer per week    Comment: occasionally  Drug use: Never   Sexual activity: Yes  Other Topics Concern   Not on file  Social History Narrative   Not on file   Social Determinants of Health   Financial Resource Strain: Not on file  Food Insecurity: Not on file  Transportation Needs: Not on file  Physical Activity: Not on file  Stress: Not on file  Social Connections: Not on file  Intimate Partner Violence: Not on file     Constitutional: Patient reports fatigue.  Denies fever, malaise, headache or abrupt weight changes.  HEENT: Denies eye pain, eye redness, ear pain, ringing in the ears, wax buildup, runny nose, nasal congestion, bloody nose, or sore throat. Respiratory: Denies difficulty breathing, shortness of breath, cough or sputum production.   Cardiovascular: Denies chest pain, chest tightness, palpitations or swelling in the  hands or feet.  Gastrointestinal: Pt reports abdominal bloating. Denies abdominal pain, constipation, diarrhea or blood in the stool.  GU: Denies urgency, frequency, pain with urination, burning sensation, blood in urine, odor or discharge. Musculoskeletal: Patient reports chronic left shoulder pain.  Denies decrease in range of motion, difficulty with gait, muscle pain or joint swelling.  Skin: Denies redness, rashes, lesions or ulcercations.  Neurological: Denies dizziness, difficulty with memory, difficulty with speech or problems with balance and coordination.  Psych: Patient reports depression.  Denies anxiety, SI/HI.  No other specific complaints in a complete review of systems (except as listed in HPI above).  Objective:   Physical Exam  BP 128/84 (BP Location: Right Arm, Patient Position: Sitting, Cuff Size: Large)   Pulse 77   Temp (!) 96.8 F (36 C) (Temporal)   Ht _0  (1.6 m)   Wt 218 lb (98.9 kg)   SpO2 98%   BMI 38.62 kg/m   Wt Readings from Last 3 Encounters:  01/25/22 215 lb (97.5 kg)  10/01/21 216 lb 0.8 oz (98 kg)  06/02/21 220 lb (99.8 kg)    General: Appears her stated age, obese, in NAD. Skin: Warm, dry and intact.  HEENT: Head: normal shape and size; Eyes: sclera white, no icterus, conjunctiva pink, PERRLA and EOMs intact;  Neck:  Neck supple, trachea midline. No masses, lumps or thyromegaly present.  Cardiovascular: Normal rate and rhythm. S1,S2 noted.  No murmur, rubs or gallops noted. No JVD or BLE edema. No carotid bruits noted. Pulmonary/Chest: Normal effort and positive vesicular breath sounds. No respiratory distress. No wheezes, rales or ronchi noted.  Abdomen: Normal bowel sounds.  Musculoskeletal: Strength 5/5 BUE/BLE.  No difficulty with gait.  Neurological: Alert and oriented. Cranial nerves II-XII grossly intact. Coordination normal.  Psychiatric: Mood and affect normal. Behavior is normal. Judgment and thought content normal.    BMET     Component Value Date/Time   NA 140 10/01/2021 0639   K 3.8 10/01/2021 0639   CL 110 10/01/2021 0639   CO2 28 10/01/2021 0639   GLUCOSE 98 10/01/2021 0639   BUN 13 10/01/2021 0639   CREATININE 0.51 10/01/2021 0639   CREATININE 0.53 06/02/2021 0923   CALCIUM 8.5 (L) 10/01/2021 0639   GFRNONAA >60 10/01/2021 0639   GFRNONAA 109 07/03/2019 0933   GFRAA >60 12/04/2019 1450   GFRAA 126 07/03/2019 0933    Lipid Panel     Component Value Date/Time   CHOL 204 (H) 06/02/2021 0923   TRIG 304 (H) 06/02/2021 0923   HDL 69 06/02/2021 0923   CHOLHDL 3.0 06/02/2021 0923   LDLCALC 94 06/02/2021 0923    CBC  Component Value Date/Time   WBC 5.3 10/01/2021 0639   RBC 3.80 (L) 10/01/2021 0639   HGB 11.4 (L) 10/01/2021 0639   HCT 34.8 (L) 10/01/2021 0639   PLT 210 10/01/2021 0639   MCV 91.6 10/01/2021 0639   MCH 30.0 10/01/2021 0639   MCHC 32.8 10/01/2021 0639   RDW 12.2 10/01/2021 0639   LYMPHSABS 1,749 07/03/2019 0933   MONOABS 0.4 02/20/2018 1601   EOSABS 59 07/03/2019 0933   BASOSABS 20 07/03/2019 0933    Hgb A1C Lab Results  Component Value Date   HGBA1C 5.4 06/27/2018            Assessment & Plan:   Preventative Health Maintenance:  Flu shot UTD Tetanus UTD Encouraged her to get her COVID-vaccine Discussed Shingrix vaccine, she will check coverage with her insurance company and schedule a nurse visit if she would like to have this done She no longer needs Pap smears Mammogram ordered-she will call to schedule Colon screening UTD Encouraged her to consume a balanced diet and exercise regimen Advised her to see an eye doctor and dentist annually We will check CBC, c-Met, lipid, A1c and hep C today  Fatigue:  Could be related to recent treatment of H. pylori versus mild depression We will check TSH, vitamin D and B12 today  RTC in 6 months, follow-up chronic conditions Webb Silversmith, NP

## 2022-02-25 LAB — HEMOGLOBIN A1C
Hgb A1c MFr Bld: 5.5 % of total Hgb (ref <5.7)
Mean Plasma Glucose: 111 mg/dL
eAG (mmol/L): 6.2 mmol/L

## 2022-02-25 LAB — CBC
HCT: 39.5 % (ref 35.0–45.0)
Hemoglobin: 13.4 g/dL (ref 11.7–15.5)
MCH: 30.2 pg (ref 27.0–33.0)
MCHC: 33.9 g/dL (ref 32.0–36.0)
MCV: 89.2 fL (ref 80.0–100.0)
MPV: 12.2 fL (ref 7.5–12.5)
Platelets: 251 10*3/uL (ref 140–400)
RBC: 4.43 10*6/uL (ref 3.80–5.10)
RDW: 12 % (ref 11.0–15.0)
WBC: 6.4 10*3/uL (ref 3.8–10.8)

## 2022-02-25 LAB — LIPID PANEL
Cholesterol: 185 mg/dL (ref <200)
HDL: 60 mg/dL (ref >=50)
LDL Cholesterol (Calc): 101 mg/dL (calc) — ABNORMAL HIGH
Non-HDL Cholesterol (Calc): 125 mg/dL (calc) (ref <130)
Total CHOL/HDL Ratio: 3.1 (calc) (ref <5.0)
Triglycerides: 138 mg/dL (ref <150)

## 2022-02-25 LAB — VITAMIN D 25 HYDROXY (VIT D DEFICIENCY, FRACTURES): Vit D, 25-Hydroxy: 39 ng/mL (ref 30–100)

## 2022-02-25 LAB — COMPLETE METABOLIC PANEL WITH GFR
AG Ratio: 1.5 (calc) (ref 1.0–2.5)
ALT: 30 U/L — ABNORMAL HIGH (ref 6–29)
AST: 18 U/L (ref 10–35)
Albumin: 4.1 g/dL (ref 3.6–5.1)
Alkaline phosphatase (APISO): 64 U/L (ref 37–153)
BUN: 12 mg/dL (ref 7–25)
CO2: 26 mmol/L (ref 20–32)
Calcium: 9.1 mg/dL (ref 8.6–10.4)
Chloride: 108 mmol/L (ref 98–110)
Creat: 0.54 mg/dL (ref 0.50–1.03)
Globulin: 2.8 g/dL (calc) (ref 1.9–3.7)
Glucose, Bld: 86 mg/dL (ref 65–99)
Potassium: 4.3 mmol/L (ref 3.5–5.3)
Sodium: 143 mmol/L (ref 135–146)
Total Bilirubin: 0.5 mg/dL (ref 0.2–1.2)
Total Protein: 6.9 g/dL (ref 6.1–8.1)
eGFR: 110 mL/min/{1.73_m2} (ref >=60)

## 2022-02-25 LAB — HEPATITIS C ANTIBODY: Hepatitis C Ab: NONREACTIVE

## 2022-02-25 LAB — TSH: TSH: 1.31 mIU/L

## 2022-02-25 LAB — VITAMIN B12: Vitamin B-12: 353 pg/mL (ref 200–1100)

## 2022-03-22 ENCOUNTER — Telehealth: Payer: Self-pay

## 2022-03-22 NOTE — Telephone Encounter (Signed)
-----   Message from Roena Malady, [place] sent at 01/27/2022 11:41 AM EDT ----- Regarding: H pylori testing H pylori testing

## 2022-03-22 NOTE — Telephone Encounter (Signed)
Left message on voicemail for pt to return my call in reference to pt stopping Pantoprazole x 2 weeks then retest for H pylori

## 2022-03-26 NOTE — Telephone Encounter (Signed)
Left message on voicemail.

## 2022-06-30 ENCOUNTER — Encounter: Payer: Self-pay | Admitting: Internal Medicine

## 2022-07-05 ENCOUNTER — Ambulatory Visit: Payer: 59 | Admitting: Internal Medicine

## 2022-07-05 ENCOUNTER — Other Ambulatory Visit: Payer: Self-pay

## 2022-07-05 DIAGNOSIS — I10 Essential (primary) hypertension: Secondary | ICD-10-CM

## 2022-07-05 MED ORDER — HYDROCHLOROTHIAZIDE 25 MG PO TABS
ORAL_TABLET | Freq: Every day | ORAL | 0 refills | Status: DC
  Filled 2022-07-05 – 2022-07-26 (×2): qty 90, 90d supply, fill #0

## 2022-07-20 ENCOUNTER — Other Ambulatory Visit: Payer: Self-pay

## 2022-07-26 ENCOUNTER — Other Ambulatory Visit: Payer: Self-pay

## 2022-09-11 IMAGING — CT CT ABD-PELV W/ CM
2 of 5 series · 15 of 46 positions shown, 17 images · IV contrast (APPLIED)
Comparison: CT of the abdomen and pelvis 02/24/2018

CLINICAL DATA: Abdominal pain and nausea.

EXAM:
CT ABDOMEN AND PELVIS WITH CONTRAST
TECHNIQUE: Multidetector CT imaging of the abdomen and pelvis was performed
using the standard protocol following bolus administration of
intravenous contrast.

[Series 2: routine abd/pel with · axial · 0.78mm/px · z∈[-954,-504]mm · 12 of 101 slices shown, 14 images]
[im 6/101  soft-tissue]
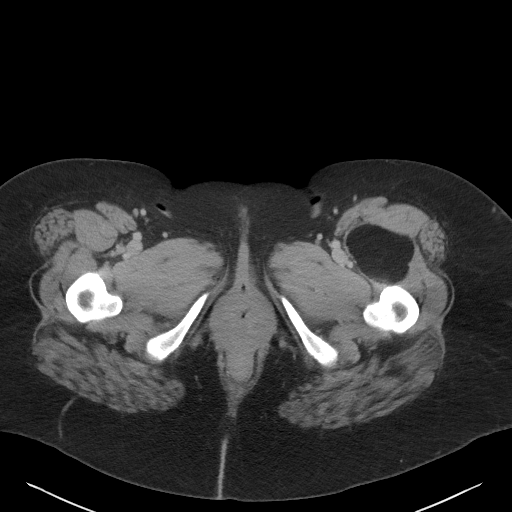
[im 6/101  bone]
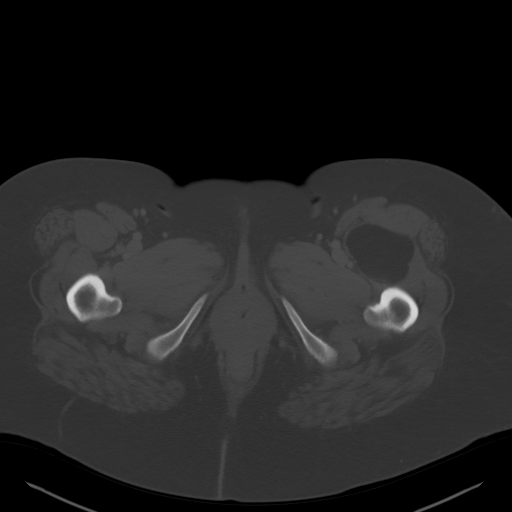
[im 16/101  soft-tissue]
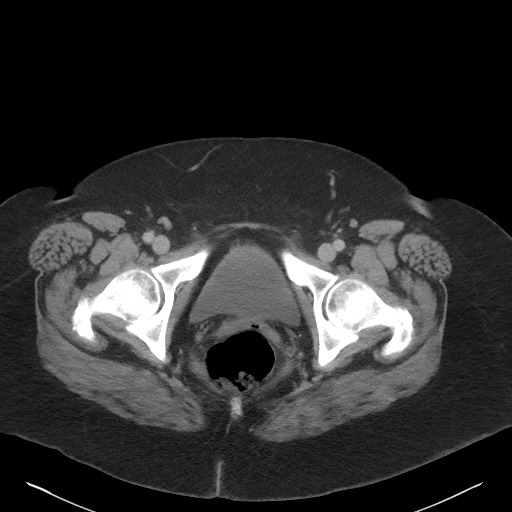
[im 21/101  soft-tissue]
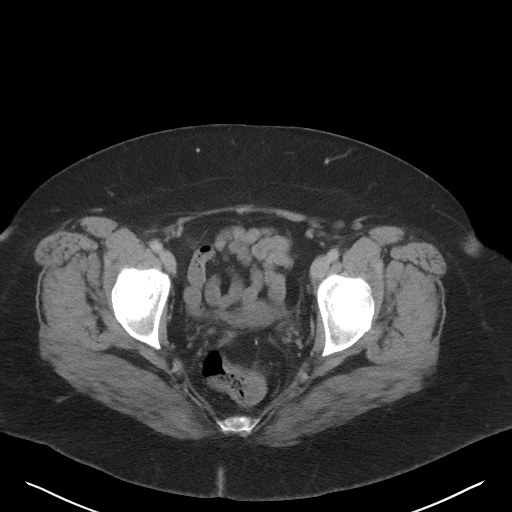
[im 31/101  soft-tissue]
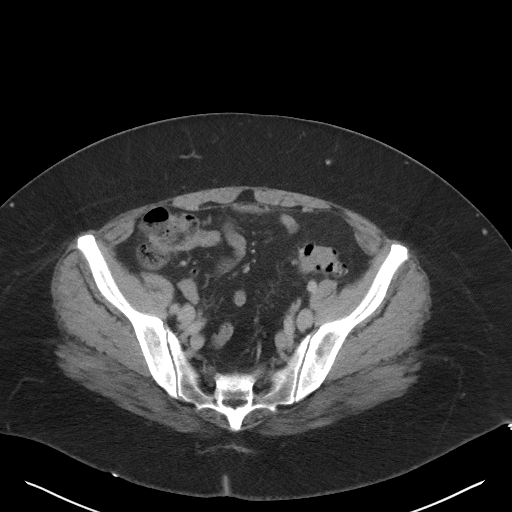
[im 41/101  soft-tissue]
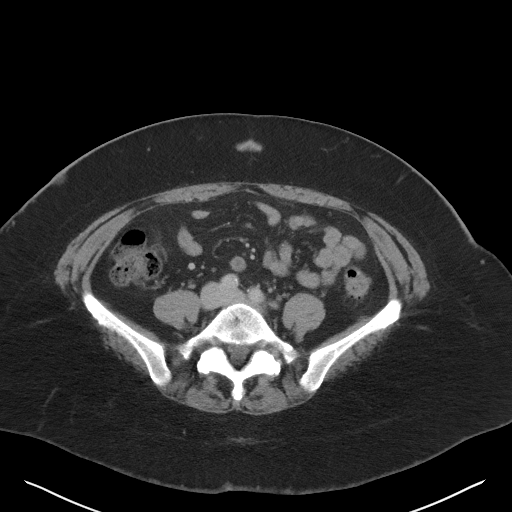
[im 46/101  soft-tissue]
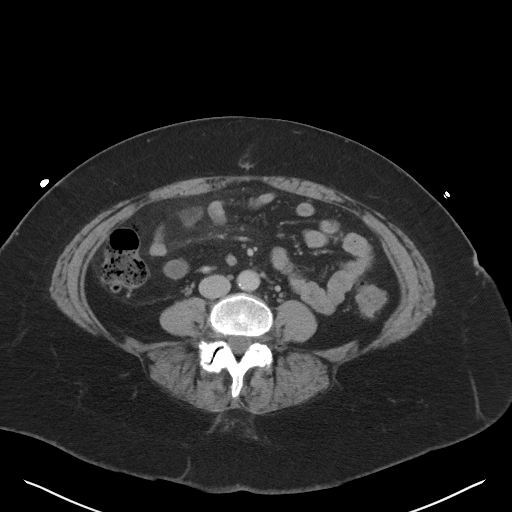
[im 56/101  soft-tissue]
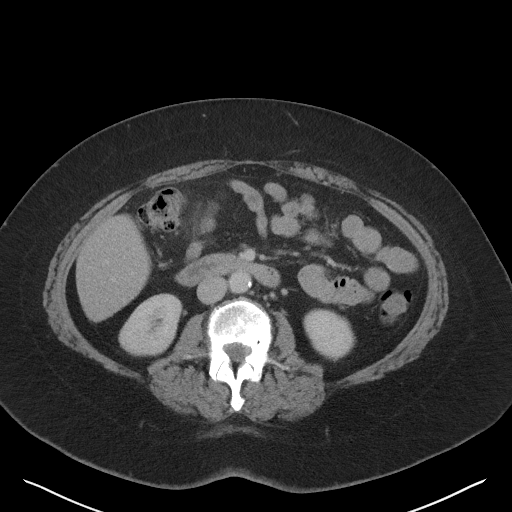
[im 61/101  soft-tissue]
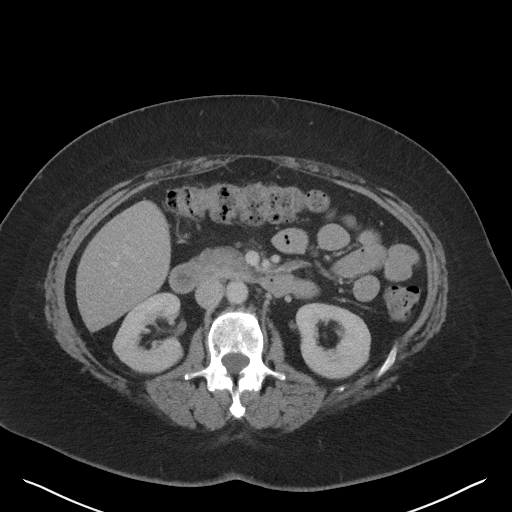
[im 71/101  soft-tissue]
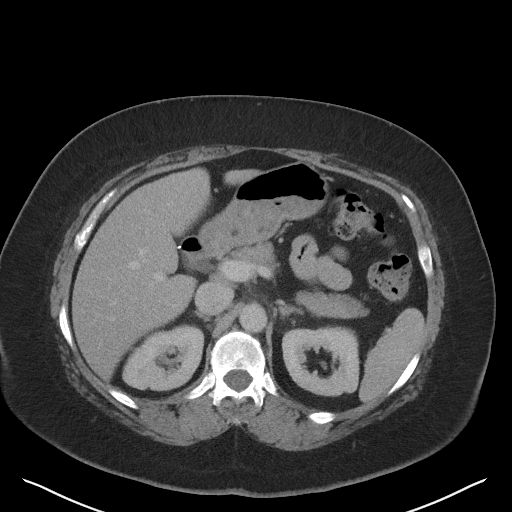
[im 71/101  bone]
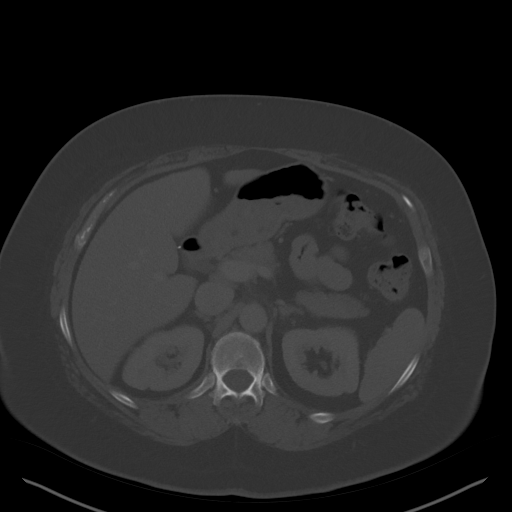
[im 81/101  soft-tissue]
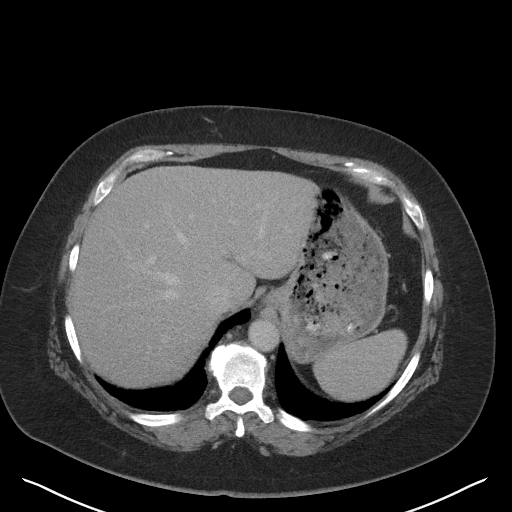
[im 86/101  soft-tissue]
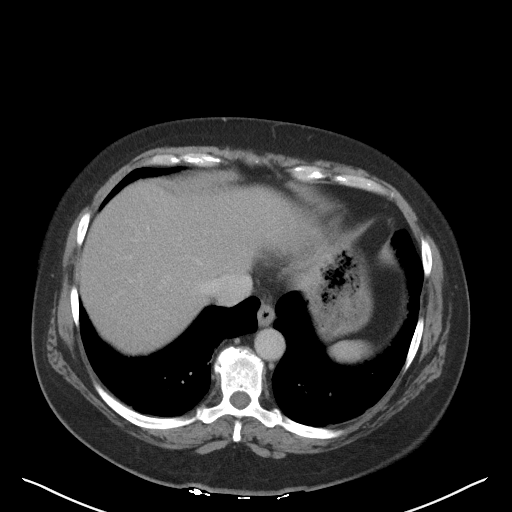
[im 96/101  soft-tissue]
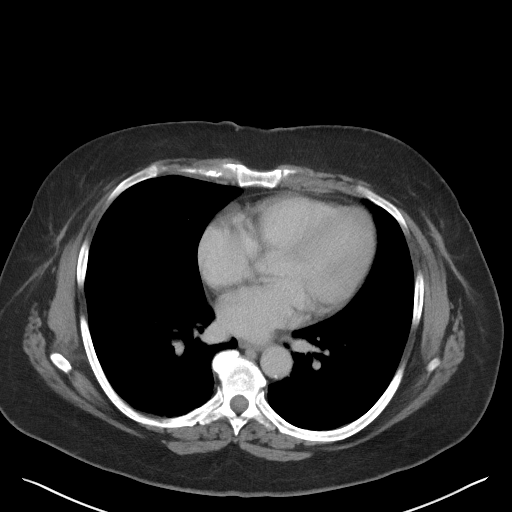

[Series 6: coronal st · coronal · 0.75mm/px · 3 of 106 slices shown]
[im 36/106  soft-tissue]
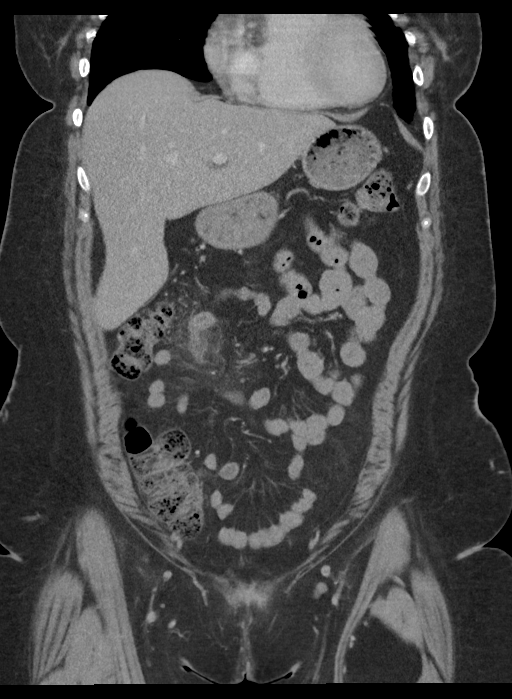
[im 47/106  soft-tissue]
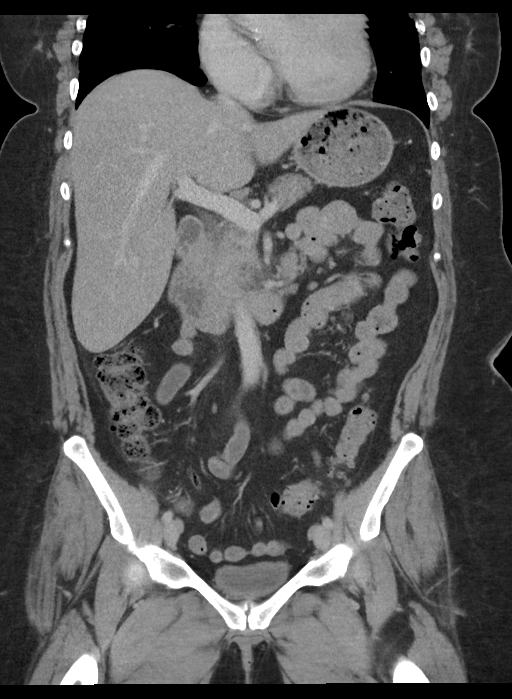
[im 59/106  soft-tissue]
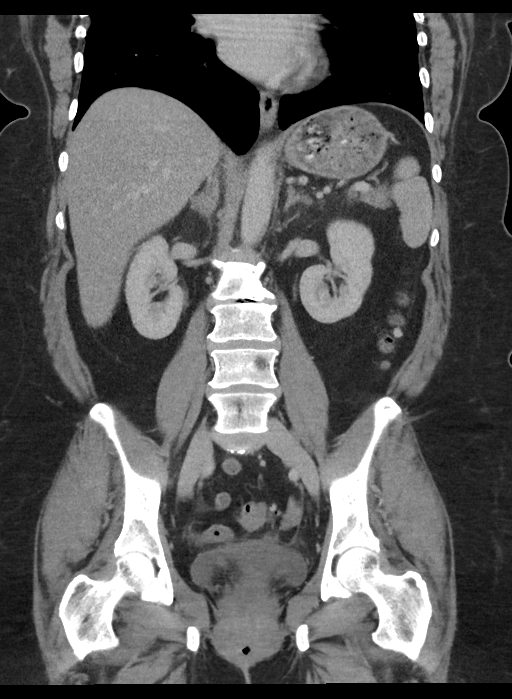

[15 of 46 positions shown; findings below may reference images not displayed]

RADIATION DOSE REDUCTION: This exam was performed according to the
departmental dose-optimization program which includes automated
exposure control, adjustment of the mA and/or kV according to
patient size and/or use of iterative reconstruction technique.

CONTRAST:  100mL OMNIPAQUE IOHEXOL 300 MG/ML  SOLN
FINDINGS: Lower chest: No acute abnormality.

Hepatobiliary: No focal liver abnormality is seen. Status post
cholecystectomy. No biliary dilatation.

Pancreas: Unremarkable. No pancreatic ductal dilatation or
surrounding inflammatory changes.

Spleen: Normal in size without focal abnormality.

Adrenals/Urinary Tract: Adrenal glands are unremarkable. Kidneys are
normal, without renal calculi, focal lesion, or hydronephrosis.
Bladder is unremarkable.

Stomach/Bowel: Acute inflammatory process is identified associated
with a segment of anterior small bowel in the right mid abdomen just
deep to the abdominal wall and roughly at the level of the distal
jejunum/proximal ileum. Significant inflammation is noted in the
surrounding mesenteric fat with an associated bilobed shaped
walled-off area measuring roughly 2.5 x 1.8 x 2.7 cm. Within this
area is complex fat density without liquid or air. Findings are
suggestive of potentially microperforation of small bowel leading to
an area of inflammation and fat necrosis.

No evidence of bowel obstruction, ileus or free intraperitoneal air.

Vascular/Lymphatic: Mild atherosclerosis of the abdominal aorta and
common iliac arteries without aneurysm. No lymphadenopathy
identified in the abdomen or pelvis.

Reproductive: Status post hysterectomy. No adnexal masses.

Other: No abdominal wall hernia or abnormality. No abdominopelvic
ascites.

Musculoskeletal: No acute or significant osseous findings.
IMPRESSION: Acute inflammatory process associated with a segment of small bowel
in the anterior mid right peritoneal cavity at the level of distal
jejunum/proximal ileum. There is regional inflammation in the
mesenteric fat with an associated bilobed area that appears more
walled-off but is internally of fat density. This is suggestive of
potentially microperforation of small bowel leading to an area of
inflammation and fat necrosis. There is no liquefied abscess at this
time or evidence of extraluminal air.

## 2022-10-11 ENCOUNTER — Emergency Department (HOSPITAL_COMMUNITY)
Admission: EM | Admit: 2022-10-11 | Discharge: 2022-10-11 | Discharge status: Against Medical Advice | Payer: 59 | Attending: Emergency Medicine | Admitting: Emergency Medicine

## 2022-10-11 DIAGNOSIS — Z5321 Procedure and treatment not carried out due to patient leaving prior to being seen by health care provider: Secondary | ICD-10-CM | POA: Diagnosis not present

## 2022-10-11 DIAGNOSIS — M25512 Pain in left shoulder: Secondary | ICD-10-CM | POA: Insufficient documentation

## 2022-10-11 DIAGNOSIS — R42 Dizziness and giddiness: Secondary | ICD-10-CM | POA: Insufficient documentation

## 2022-10-11 DIAGNOSIS — R079 Chest pain, unspecified: Secondary | ICD-10-CM | POA: Insufficient documentation

## 2022-10-11 DIAGNOSIS — R0789 Other chest pain: Secondary | ICD-10-CM | POA: Diagnosis not present

## 2022-10-11 LAB — COMPREHENSIVE METABOLIC PANEL
ALT: 23 U/L (ref 0–44)
AST: 16 U/L (ref 15–41)
Albumin: 3.7 g/dL (ref 3.5–5.0)
Alkaline Phosphatase: 73 U/L (ref 38–126)
Anion gap: 13 (ref 5–15)
BUN: 10 mg/dL (ref 6–20)
CO2: 26 mmol/L (ref 22–32)
Calcium: 9 mg/dL (ref 8.9–10.3)
Chloride: 100 mmol/L (ref 98–111)
Creatinine, Ser: 0.58 mg/dL (ref 0.44–1.00)
GFR, Estimated: >60 mL/min (ref >60)
Glucose, Bld: 101 mg/dL — ABNORMAL HIGH (ref 70–99)
Potassium: 3.3 mmol/L — ABNORMAL LOW (ref 3.5–5.1)
Sodium: 139 mmol/L (ref 135–145)
Total Bilirubin: 0.2 mg/dL — ABNORMAL LOW (ref 0.3–1.2)
Total Protein: 6.9 g/dL (ref 6.5–8.1)

## 2022-10-11 LAB — CBC
HCT: 38 % (ref 36.0–46.0)
Hemoglobin: 12.3 g/dL (ref 12.0–15.0)
MCH: 29.3 pg (ref 26.0–34.0)
MCHC: 32.4 g/dL (ref 30.0–36.0)
MCV: 90.5 fL (ref 80.0–100.0)
Platelets: 267 10*3/uL (ref 150–400)
RBC: 4.2 MIL/uL (ref 3.87–5.11)
RDW: 12.2 % (ref 11.5–15.5)
WBC: 6.9 10*3/uL (ref 4.0–10.5)
nRBC: 0 % (ref 0.0–0.2)

## 2022-10-11 LAB — TROPONIN I (HIGH SENSITIVITY): Troponin I (High Sensitivity): 8 ng/L (ref <18)

## 2022-10-11 NOTE — ED Notes (Signed)
Pt. Called for room with no response

## 2022-10-11 NOTE — ED Provider Triage Note (Signed)
Emergency Medicine Provider Triage Evaluation Note  Yannet Norell , a 52 y.o. female  was evaluated in triage.  Pt complains of stabbing pains on the left side of her chest above the breast since about 10am this morning. Also notes some dizziness. Pain started in an interview. Comes and goes, lasting about a couple seconds at a time. Not aggravated by taking deep breaths. No back pain, abdominal pain, nausea, vomiting, jaw pain, shortness of breath. Has left shoulder/arm pain but this is consistent with baseline as she has prior left shoulder surgery.   Patient denies any previous cardiac history.  Review of Systems  Positive: As above  Negative: As above  Physical Exam  BP 133/74 (BP Location: Left Arm)   Pulse 66   Temp 98 F (36.7 C) (Oral)   Resp 17   SpO2 99%  Gen:   Awake, no distress   Resp:  Normal effort  MSK:   Moves extremities without difficulty  Other:  Regular rate and rhythm  Medical Decision Making  Medically screening exam initiated at 1:33 PM.  Appropriate orders placed.  [name] was informed that the remainder of the evaluation will be completed by another provider, this initial triage assessment does not replace that evaluation, and the importance of remaining in the ED until their evaluation is complete.     Arabella Merles, PA-C 10/11/22 1343

## 2022-10-19 ENCOUNTER — Encounter: Payer: Self-pay | Admitting: Internal Medicine

## 2022-10-19 ENCOUNTER — Ambulatory Visit (INDEPENDENT_AMBULATORY_CARE_PROVIDER_SITE_OTHER): Payer: 59 | Admitting: Internal Medicine

## 2022-10-19 ENCOUNTER — Other Ambulatory Visit: Payer: Self-pay

## 2022-10-19 VITALS — BP 122/84 | HR 74 | Temp 96.8°F | Wt 217.0 lb

## 2022-10-19 DIAGNOSIS — K219 Gastro-esophageal reflux disease without esophagitis: Secondary | ICD-10-CM

## 2022-10-19 DIAGNOSIS — R0789 Other chest pain: Secondary | ICD-10-CM

## 2022-10-19 DIAGNOSIS — R739 Hyperglycemia, unspecified: Secondary | ICD-10-CM

## 2022-10-19 DIAGNOSIS — I10 Essential (primary) hypertension: Secondary | ICD-10-CM | POA: Diagnosis not present

## 2022-10-19 DIAGNOSIS — Z6838 Body mass index (BMI) 38.0-38.9, adult: Secondary | ICD-10-CM

## 2022-10-19 DIAGNOSIS — M25512 Pain in left shoulder: Secondary | ICD-10-CM | POA: Diagnosis not present

## 2022-10-19 DIAGNOSIS — I700 Atherosclerosis of aorta: Secondary | ICD-10-CM | POA: Diagnosis not present

## 2022-10-19 DIAGNOSIS — E876 Hypokalemia: Secondary | ICD-10-CM

## 2022-10-19 DIAGNOSIS — R252 Cramp and spasm: Secondary | ICD-10-CM

## 2022-10-19 DIAGNOSIS — R7309 Other abnormal glucose: Secondary | ICD-10-CM

## 2022-10-19 DIAGNOSIS — E781 Pure hyperglyceridemia: Secondary | ICD-10-CM | POA: Diagnosis not present

## 2022-10-19 DIAGNOSIS — G8929 Other chronic pain: Secondary | ICD-10-CM

## 2022-10-19 MED ORDER — POTASSIUM CHLORIDE CRYS ER 10 MEQ PO TBCR
10.0000 meq | EXTENDED_RELEASE_TABLET | Freq: Two times a day (BID) | ORAL | 1 refills | Status: DC
  Filled 2022-10-19 – 2022-11-04 (×2): qty 90, 45d supply, fill #0

## 2022-10-19 MED ORDER — ASPIRIN 81 MG PO TBEC: 81.0000 mg | DELAYED_RELEASE_TABLET | Freq: Every day | ORAL | 12 refills | Status: AC

## 2022-10-19 NOTE — Assessment & Plan Note (Signed)
She declines referral to PT at this time She plans to follow-up with orthopedics for repeat shoulder injection

## 2022-10-19 NOTE — Assessment & Plan Note (Signed)
C-Met and lipid profile ordered Encouraged her to consume a low-fat diet Advised her to start taking aspirin 81 mg daily

## 2022-10-19 NOTE — Assessment & Plan Note (Signed)
Encourage diet and exercise for weight loss 

## 2022-10-19 NOTE — Assessment & Plan Note (Signed)
Avoid foods that trigger reflux Encourage weight loss as this can help reduce reflux symptoms Will discontinue pantoprazole due to nonuse Continue Tums OTC as needed

## 2022-10-19 NOTE — Assessment & Plan Note (Signed)
C-Met and lipid profile today Encouraged her to consume a low-fat diet We will discuss statin therapy if needed based on labs

## 2022-10-19 NOTE — Assessment & Plan Note (Signed)
Controlled on hydrochlorothiazide Reinforced DASH diet and exercise for weight loss We will plan to check c-Met in 2 weeks

## 2022-10-19 NOTE — Progress Notes (Signed)
Subjective:    Patient ID: Caroline Turner, female    DOB: [birth year], 52 y.o.   MRN: [id number]  HPI  Patient presents to clinic today for follow-up of chronic conditions.  HTN: Her BP today is 122/84.  She is taking HCTZ as prescribed.  ECG from 09/2022 reviewed.  GERD: Triggered by spicy foods.  She takes is not taking pantoprazole as prescribed but is taking tums as needed with good relief of symptoms.  Upper GI from 01/2022 reviewed.  HLD with aortic atherosclerosis: Her last LDL was 101, triglycerides 138, 02/2022.  She is not taking any cholesterol-lowering medication at this time.  She is not taking any aspirin.  She tries to consume a low-fat diet.  Chronic left shoulder pain: Status post labral repair 2013.  She takes ibuprofen or naproxen OTC with some relief of symptoms. She intermittently gets shoulder injections. She is following with orthopedics.  She also reports she went to the ER 6/17 with c/o chest pain.  This started 1 to 2 days prior to going to the ER.  She describes the pain as intermittent sharp pains in the left side of her chest.  She denies associated chest tightness, shortness of breath, sweating, syncope, nausea or vomiting.  She has been having muscle cramps in her calves as well.  ECG showed NSR with PAC's. CMET showed mild hypokalemia but other labs were unremarkable including troponin. She LWBS.  Review of Systems     Past Medical History:  Diagnosis Date   Heart murmur    pt reports she's had an echo to check this out & was found to be benign   History of swelling of feet    Hyperlipidemia    Hypertension    Kidney stones    PONV (postoperative nausea and vomiting)     Current Outpatient Medications  Medication Sig Dispense Refill   acetaminophen (TYLENOL) 500 MG tablet Take 1 tablet (500 mg total) by mouth every 6 (six) hours as needed. For use AFTER surgery 30 tablet 0   Bismuth/Metronidaz/Tetracyclin (PYLERA) 140-125-125 MG CAPS Take 3 capsules  all together by mouth 4 (four) times daily - after meals and at bedtime. 120 capsule 0   Ergocalciferol (VITAMIN D2) 50 MCG (2000 UT) TABS Take by mouth.      fenofibrate 54 MG tablet Take 1 tablet (54 mg total) by mouth daily. (Patient not taking: Reported on 02/24/2022) 90 tablet 0   hydrochlorothiazide (HYDRODIURIL) 25 MG tablet TAKE 1 TABLET BY MOUTH DAILY 90 tablet 0   pantoprazole (PROTONIX) 20 MG tablet Take 1 tablet (20 mg total) by mouth 2 (two) times daily. (Patient not taking: Reported on 02/24/2022) 28 tablet 0   No current facility-administered medications for this visit.    No Known Allergies  Family History  Problem Relation Age of Onset   Cancer Father        lung, liver, mets to brain   Dementia Father    Vascular Disease Mother    Dementia Mother        vascular dementia   Alzheimer's disease Mother     Social History   Socioeconomic History   Marital status: Married    Spouse name: Not on file   Number of children: 3   Years of education: Not on file   Highest education level: Master's degree (e.g., MA, MS, MEng, MEd, MSW, MBA)  Occupational History   Not on file  Tobacco Use   Smoking status: Some Days  Packs/day: .1    Types: Cigarettes   Smokeless tobacco: Never   Tobacco comments:    5 cigarettes a week  Vaping Use   Vaping Use: Never used  Substance and Sexual Activity   Alcohol use: Yes    Alcohol/week: 1.0 standard drink of alcohol    Types: 1 Cans of beer per week    Comment: occasionally   Drug use: Never   Sexual activity: Yes  Other Topics Concern   Not on file  Social History Narrative   Not on file   Social Determinants of Health   Financial Resource Strain: Not on file  Food Insecurity: Not on file  Transportation Needs: Not on file  Physical Activity: Not on file  Stress: Not on file  Social Connections: Not on file  Intimate Partner Violence: Not on file     Constitutional: Denies fever, malaise, fatigue, headache or  abrupt weight changes.  HEENT: Denies eye pain, eye redness, ear pain, ringing in the ears, wax buildup, runny nose, nasal congestion, bloody nose, or sore throat. Respiratory: Denies difficulty breathing, shortness of breath, cough or sputum production.   Cardiovascular: Patient reports chest pain that has now resolved.  Denies chest tightness, palpitations or swelling in the hands or feet.  Gastrointestinal: Denies abdominal pain, bloating, constipation, diarrhea or blood in the stool.  GU: Denies urgency, frequency, pain with urination, burning sensation, blood in urine, odor or discharge. Musculoskeletal: Patient reports chronic left shoulder pain, decrease in range of motion and muscle cramps.  Denies decrease in range of motion, difficulty with gait, or joint swelling.  Skin: Denies redness, rashes, lesions or ulcercations.  Neurological: Denies dizziness, difficulty with memory, difficulty with speech or problems with balance and coordination.  Psych: Denies anxiety, depression, SI/HI.  No other specific complaints in a complete review of systems (except as listed in HPI above).  Objective:   Physical Exam BP 122/84 (BP Location: Left Arm, Patient Position: Sitting, Cuff Size: Large)   Pulse 74   Temp (!) 96.8 F (36 C) (Temporal)   Wt 217 lb (98.4 kg)   SpO2 96%   BMI 38.44 kg/m   Wt Readings from Last 3 Encounters:  02/24/22 218 lb (98.9 kg)  01/25/22 215 lb (97.5 kg)  10/01/21 216 lb 0.8 oz (98 kg)    General: Appears her stated age, obese, in NAD. Skin: Warm, dry and intact.  HEENT: Head: normal shape and size; Eyes: sclera white, no icterus, conjunctiva pink, PERRLA and EOMs intact;  Cardiovascular: Normal rate and rhythm. S1,S2 noted.  No murmur, rubs or gallops noted. No JVD.  Trace nonpitting BLE edema. No carotid bruits noted. Pulmonary/Chest: Normal effort and positive vesicular breath sounds. No respiratory distress. No wheezes, rales or ronchi noted.  Abdomen:  Soft and nontender. Normal bowel sounds.  Musculoskeletal: Decreased external and internal rotation of the left shoulder.  No pain with palpation of the left shoulder.  Negative drop can test on the left.  No difficulty with gait.  Neurological: Alert and oriented. Coordination normal.  Psychiatric: Mood and affect normal. Behavior is normal. Judgment and thought content normal.     BMET    Component Value Date/Time   NA 139 10/11/2022 1349   K 3.3 (L) 10/11/2022 1349   CL 100 10/11/2022 1349   CO2 26 10/11/2022 1349   GLUCOSE 101 (H) 10/11/2022 1349   BUN 10 10/11/2022 1349   CREATININE 0.58 10/11/2022 1349   CREATININE 0.54 02/24/2022 0929   CALCIUM  9.0 10/11/2022 1349   GFRNONAA >60 10/11/2022 1349   GFRNONAA 109 07/03/2019 0933   GFRAA >60 12/04/2019 1450   GFRAA 126 07/03/2019 0933    Lipid Panel     Component Value Date/Time   CHOL 185 02/24/2022 0929   TRIG 138 02/24/2022 0929   HDL 60 02/24/2022 0929   CHOLHDL 3.1 02/24/2022 0929   LDLCALC 101 (H) 02/24/2022 0929    CBC    Component Value Date/Time   WBC 6.9 10/11/2022 1349   RBC 4.20 10/11/2022 1349   HGB 12.3 10/11/2022 1349   HCT 38.0 10/11/2022 1349   PLT 267 10/11/2022 1349   MCV 90.5 10/11/2022 1349   MCH 29.3 10/11/2022 1349   MCHC 32.4 10/11/2022 1349   RDW 12.2 10/11/2022 1349   LYMPHSABS 1,749 07/03/2019 0933   MONOABS 0.4 02/20/2018 1601   EOSABS 59 07/03/2019 0933   BASOSABS 20 07/03/2019 0933    Hgb A1C Lab Results  Component Value Date   HGBA1C 5.5 02/24/2022            Assessment & Plan:   Chest Pain, muscle cramps, hypokalemia:  ER labs and procedures reviewed Will start potassium 10 mEq daily as she is on hydrochlorothiazide We will plan to repeat bmet in 2 weeks  RTC in 2 weeks for lab only appointment, 5 months for your annual exam Nicki Reaper, NP

## 2022-10-19 NOTE — Patient Instructions (Signed)
Hypokalemia Hypokalemia means that the amount of potassium in the blood is lower than normal. Potassium is a mineral (electrolyte) that helps regulate the amount of fluid in the body. It also stimulates muscle tightening (contraction) and helps nerves work properly. Normally, most of the body's potassium is inside cells, and only a very small amount is in the blood. Because the amount in the blood is so small, minor changes to potassium levels in the blood can be life-threatening. What are the causes? This condition may be caused by: Antibiotic medicine. Diarrhea or vomiting. Taking too much of a medicine that helps you have a bowel movement (laxative) can cause diarrhea and lead to hypokalemia. Chronic kidney disease (CKD). Medicines that help the body get rid of excess fluid (diuretics). Eating disorders, such as anorexia or bulimia. Low magnesium levels in the body. Sweating a lot. What are the signs or symptoms? Symptoms of this condition include: Weakness. Constipation. Fatigue. Muscle cramps. Mental confusion. Skipped heartbeats or irregular heartbeat (palpitations). Tingling or numbness. How is this diagnosed? This condition is diagnosed with a blood test. How is this treated? This condition may be treated by: Taking potassium supplements. Adjusting the medicines that you take. Eating more foods that contain a lot of potassium. If your potassium level is very low, you may need to get potassium through an IV and be monitored in the hospital. Follow these instructions at home: Eating and drinking  Eat a healthy diet. A healthy diet includes fresh fruits and vegetables, whole grains, healthy fats, and lean proteins. If told, eat more foods that contain a lot of potassium. These include: Nuts, such as peanuts and pistachios. Seeds, such as sunflower seeds and pumpkin seeds. Peas, lentils, and lima beans. Whole grain and bran cereals and breads. Fresh fruits and vegetables,  such as apricots, avocado, bananas, cantaloupe, kiwi, oranges, tomatoes, asparagus, and potatoes. Juices, such as orange, tomato, and prune. Lean meats, including fish. Milk and milk products, such as yogurt. General instructions Take over-the-counter and prescription medicines only as told by your health care provider. This includes vitamins, natural food products, and supplements. Keep all follow-up visits. This is important. Contact a health care provider if: You have weakness that gets worse. You feel your heart pounding or racing. You vomit. You have diarrhea. You have diabetes and you have trouble keeping your blood sugar in your target range. Get help right away if: You have chest pain. You have shortness of breath. You have vomiting or diarrhea that lasts for more than 2 days. You faint. These symptoms may be an emergency. Get help right away. Call 911. Do not wait to see if the symptoms will go away. Do not drive yourself to the hospital. Summary Hypokalemia means that the amount of potassium in the blood is lower than normal. This condition is diagnosed with a blood test. Hypokalemia may be treated by taking potassium supplements, adjusting the medicines that you take, or eating more foods that are high in potassium. If your potassium level is very low, you may need to get potassium through an IV and be monitored in the hospital. This information is not intended to replace advice given to you by your health care provider. Make sure you discuss any questions you have with your health care provider. Document Revised: 12/25/2020 Document Reviewed: 12/25/2020 Elsevier Patient Education  2024 [employer]  

## 2022-11-02 ENCOUNTER — Other Ambulatory Visit: Payer: Self-pay

## 2022-11-04 ENCOUNTER — Encounter: Payer: Self-pay | Admitting: Internal Medicine

## 2022-11-04 ENCOUNTER — Other Ambulatory Visit: Payer: Self-pay

## 2022-11-04 ENCOUNTER — Ambulatory Visit (INDEPENDENT_AMBULATORY_CARE_PROVIDER_SITE_OTHER): Payer: 59 | Admitting: Internal Medicine

## 2022-11-04 ENCOUNTER — Ambulatory Visit: Payer: Self-pay | Admitting: *Deleted

## 2022-11-04 VITALS — BP 130/82 | HR 78 | Temp 96.6°F | Wt 209.0 lb

## 2022-11-04 DIAGNOSIS — R051 Acute cough: Secondary | ICD-10-CM

## 2022-11-04 DIAGNOSIS — B9689 Other specified bacterial agents as the cause of diseases classified elsewhere: Secondary | ICD-10-CM | POA: Diagnosis not present

## 2022-11-04 DIAGNOSIS — J019 Acute sinusitis, unspecified: Secondary | ICD-10-CM | POA: Diagnosis not present

## 2022-11-04 DIAGNOSIS — R7309 Other abnormal glucose: Secondary | ICD-10-CM | POA: Diagnosis not present

## 2022-11-04 DIAGNOSIS — E781 Pure hyperglyceridemia: Secondary | ICD-10-CM | POA: Diagnosis not present

## 2022-11-04 LAB — POC COVID19 BINAXNOW: SARS Coronavirus 2 Ag: NEGATIVE

## 2022-11-04 MED ORDER — AMOXICILLIN-POT CLAVULANATE 875-125 MG PO TABS
1.0000 | ORAL_TABLET | Freq: Two times a day (BID) | ORAL | 0 refills | Status: DC
  Filled 2022-11-04: qty 20, 10d supply, fill #0

## 2022-11-04 NOTE — Patient Instructions (Signed)

## 2022-11-04 NOTE — Progress Notes (Signed)
HPI  Pt presents to the clinic today with c/o headache, sinus pressure, runny nose, nasal congestion, ear pain, sore throat, cough and shortness of breath. She reports this started 1 week ago. The headache is located in her forehead. She is blowing blood tinged green mucous out of her nose. She denies difficulty swallowing. The cough is productive of green mucous. She denies vomiting, diarrhea, fever, chills or body aches. She has tried Nyquil with minimal relief of symptoms. She has not had sick contacts.  Review of Systems      Past Medical History:  Diagnosis Date   Heart murmur    pt reports she's had an echo to check this out & was found to be benign   History of swelling of feet    Hyperlipidemia    Hypertension    Kidney stones    PONV (postoperative nausea and vomiting)     Family History  Problem Relation Age of Onset   Cancer Father        lung, liver, mets to brain   Dementia Father    Vascular Disease Mother    Dementia Mother        vascular dementia   Alzheimer's disease Mother     Social History   Socioeconomic History   Marital status: Married    Spouse name: Not on file   Number of children: 3   Years of education: Not on file   Highest education level: Master's degree (e.g., MA, MS, MEng, MEd, MSW, MBA)  Occupational History   Not on file  Tobacco Use   Smoking status: Some Days    Current packs/day: 0.10    Types: Cigarettes   Smokeless tobacco: Never   Tobacco comments:    5 cigarettes a week  Vaping Use   Vaping status: Never Used  Substance and Sexual Activity   Alcohol use: Yes    Alcohol/week: 1.0 standard drink of alcohol    Types: 1 Cans of beer per week    Comment: occasionally   Drug use: Never   Sexual activity: Yes  Other Topics Concern   Not on file  Social History Narrative   Not on file   Social Determinants of Health   Financial Resource Strain: Not on file  Food Insecurity: Not on file  Transportation Needs: Not on  file  Physical Activity: Not on file  Stress: Not on file  Social Connections: Not on file  Intimate Partner Violence: Not on file    No Known Allergies   Constitutional: Positive headache, fatigue. Denies fever or abrupt weight changes.  HEENT:  Positive  sinus pressure, runny nose, nasal congestion, ear pain and sore throat. Denies eye redness, eye pain, pressure behind the eyes, ear pain, ringing in the ears, wax buildup, or bloody nose. Respiratory: Positive cough and shortness of breath. Denies difficulty breathing.  Cardiovascular: Denies chest pain, chest tightness, palpitations or swelling in the hands or feet.   No other specific complaints in a complete review of systems (except as listed in HPI above).  Objective:  BP 130/82 (BP Location: Left Arm, Patient Position: Sitting, Cuff Size: Normal)   Pulse 78   Temp (!) 96.6 F (35.9 C) (Temporal)   Wt 209 lb (94.8 kg)   SpO2 95%   BMI 37.02 kg/m   Wt Readings from Last 3 Encounters:  10/19/22 217 lb (98.4 kg)  02/24/22 218 lb (98.9 kg)  01/25/22 215 lb (97.5 kg)     General: Appears her  stated age, obese, in NAD. HEENT: Head: normal shape and size, frontal sinus tenderness noted; Eyes: sclera white, no icterus, conjunctiva pink; Ears: Tm's gray and intact, normal light reflex; Nose: mucosa pink and moist, septum midline; Throat/Mouth: + PND. Teeth present, mucosa erythematous and moist, no exudate noted, no lesions or ulcerations noted.  Neck: No cervical lymphadenopathy.  Cardiovascular: Normal rate and rhythm. S1,S2 noted.  No murmur, rubs or gallops noted.  Pulmonary/Chest: Normal effort and positive vesicular breath sounds. No respiratory distress. No wheezes, rales or ronchi noted.       Assessment & Plan:   Acute bacterial sinusitis  Rapid COVID-negative Get some rest and drink plenty of water Can use a neti pot which can be purchased from your local pharmacy Recommend Zyrtec and Flonase OTC for symptom  management Rx for Augmentin 875-125 mg twice daily x 10 days Continue to "OTC as needed for cough  RTC in 4 months for annual exam   Nicki Reaper, NP

## 2022-11-04 NOTE — Telephone Encounter (Signed)
  Chief Complaint: Cough Symptoms: Cough,productive for greenish phlegm, sinus pain (mild now) sore throat, CP "From coughing so much, ribs hurt." Frequency: 1 week Pertinent Negatives: Patient denies wheezing Disposition: [] ED /[] Urgent Care (no appt availability in office) / [x] Appointment(In office/virtual)/ []  [facility]/ [] Home Care/ [] Refused Recommended Disposition /[] [facility]/ []  Follow-up with PCP Additional Notes: Agent secured appt prior to triage. Care advise provided, pt verbalizes understanding. Reason for Disposition  SEVERE coughing spells (e.g., whooping sound after coughing, vomiting after coughing)  Answer Assessment - Initial Assessment Questions 1. ONSET: "When did the cough begin?"      1 week ago 2. SEVERITY: "How bad is the cough today?"      Severe 3. SPUTUM: "Describe the color of your sputum" (none, dry cough; clear, white, yellow, green)     Greenish 4. HEMOPTYSIS: "Are you coughing up any blood?" If so ask: "How much?" (flecks, streaks, tablespoons, etc.)     Nose bleeds 5. DIFFICULTY BREATHING: "Are you having difficulty breathing?" If Yes, ask: "How bad is it?" (e.g., mild, moderate, severe)    - MILD: No SOB at rest, mild SOB with walking, speaks normally in sentences, can lie down, no retractions, pulse < 100.    - MODERATE: SOB at rest, SOB with minimal exertion and prefers to sit, cannot lie down flat, speaks in phrases, mild retractions, audible wheezing, pulse 100-120.    - SEVERE: Very SOB at rest, speaks in single words, struggling to breathe, sitting hunched forward, retractions, pulse > 120      Mild SOB with coughing 6. FEVER: "Do you have a fever?" If Yes, ask: "What is your temperature, how was it measured, and when did it start?"     No 7. CARDIAC HISTORY: "Do you have any history of heart disease?" (e.g., heart attack, congestive heart failure)      *No Answer* 8. LUNG HISTORY: "Do you have any history of lung  disease?"  (e.g., pulmonary embolus, asthma, emphysema)     *No Answer* 9. PE RISK FACTORS: "Do you have a history of blood clots?" (or: recent major surgery, recent prolonged travel, bedridden)     *No Answer* 10. OTHER SYMPTOMS: "Do you have any other symptoms?" (e.g., runny nose, wheezing, chest pain)       Sore throat  Protocols used: Cough - Acute Productive-A-AH

## 2022-11-05 LAB — HEMOGLOBIN A1C: Hgb A1c MFr Bld: 5.6 % of total Hgb (ref <5.7)

## 2022-11-06 LAB — COMPLETE METABOLIC PANEL WITH GFR
AG Ratio: 1.4 (calc) (ref 1.0–2.5)
ALT: 22 U/L (ref 6–29)
AST: 15 U/L (ref 10–35)
Albumin: 4.2 g/dL (ref 3.6–5.1)
Alkaline phosphatase (APISO): 69 U/L (ref 37–153)
BUN: 15 mg/dL (ref 7–25)
CO2: 23 mmol/L (ref 20–32)
Calcium: 9.5 mg/dL (ref 8.6–10.4)
Chloride: 106 mmol/L (ref 98–110)
Creat: 0.62 mg/dL (ref 0.50–1.03)
Globulin: 2.9 g/dL (calc) (ref 1.9–3.7)
Glucose, Bld: 97 mg/dL (ref 65–99)
Potassium: 4.1 mmol/L (ref 3.5–5.3)
Sodium: 145 mmol/L (ref 135–146)
Total Bilirubin: 0.3 mg/dL (ref 0.2–1.2)
Total Protein: 7.1 g/dL (ref 6.1–8.1)
eGFR: 106 mL/min/{1.73_m2} (ref >=60)

## 2022-11-06 LAB — LIPID PANEL
Cholesterol: 194 mg/dL (ref <200)
HDL: 75 mg/dL (ref >=50)
LDL Cholesterol (Calc): 96 mg/dL (calc)
Non-HDL Cholesterol (Calc): 119 mg/dL (calc) (ref <130)
Total CHOL/HDL Ratio: 2.6 (calc) (ref <5.0)
Triglycerides: 133 mg/dL (ref <150)

## 2022-11-06 LAB — HEMOGLOBIN A1C
Mean Plasma Glucose: 114 mg/dL
eAG (mmol/L): 6.3 mmol/L

## 2022-11-08 ENCOUNTER — Other Ambulatory Visit: Payer: 59

## 2023-02-28 ENCOUNTER — Encounter: Payer: 59 | Admitting: Internal Medicine

## 2023-02-28 NOTE — Progress Notes (Deleted)
Subjective:    Patient ID: Caroline Turner, female    DOB: 08/17/1970, 52 y.o.   MRN: [id number]  HPI  Patient presents to clinic today for her annual exam.  Flu: 01/2022 Tetanus: 06/2018 COVID: Shingrix: Never Pap smear: Hysterectomy Mammogram: 09/2020 Colon screening: 01/2022 Vision screening: Dentist:  Diet: Exercise:  Review of Systems     Past Medical History:  Diagnosis Date   Heart murmur    pt reports she's had an echo to check this out & was found to be benign   History of swelling of feet    Hyperlipidemia    Hypertension    Kidney stones    PONV (postoperative nausea and vomiting)     Current Outpatient Medications  Medication Sig Dispense Refill   acetaminophen (TYLENOL) 500 MG tablet Take 1 tablet (500 mg total) by mouth every 6 (six) hours as needed. For use AFTER surgery 30 tablet 0   amoxicillin-clavulanate (AUGMENTIN) 875-125 MG tablet Take 1 tablet by mouth 2 (two) times daily. 20 tablet 0   aspirin EC 81 MG tablet Take 1 tablet (81 mg total) by mouth daily. Swallow whole. 30 tablet 12   Ergocalciferol (VITAMIN D2) 50 MCG (2000 UT) TABS Take by mouth.      fenofibrate 54 MG tablet Take 1 tablet (54 mg total) by mouth daily. (Patient not taking: Reported on 02/24/2022) 90 tablet 0   hydrochlorothiazide (HYDRODIURIL) 25 MG tablet TAKE 1 TABLET BY MOUTH DAILY 90 tablet 0   potassium chloride (KLOR-CON M) 10 MEQ tablet Take 1 tablet (10 mEq total) by mouth 2 (two) times daily. 90 tablet 1   No current facility-administered medications for this visit.    No Known Allergies  Family History  Problem Relation Age of Onset   Cancer Father        lung, liver, mets to brain   Dementia Father    Vascular Disease Mother    Dementia Mother        vascular dementia   Alzheimer's disease Mother     Social History   Socioeconomic History   Marital status: Married    Spouse name: Not on file   Number of children: 3   Years of education: Not on file    Highest education level: Master's degree (e.g., MA, MS, MEng, MEd, MSW, MBA)  Occupational History   Not on file  Tobacco Use   Smoking status: Some Days    Current packs/day: 0.10    Types: Cigarettes   Smokeless tobacco: Never   Tobacco comments:    5 cigarettes a week  Vaping Use   Vaping status: Never Used  Substance and Sexual Activity   Alcohol use: Yes    Alcohol/week: 1.0 standard drink of alcohol    Types: 1 Cans of beer per week    Comment: occasionally   Drug use: Never   Sexual activity: Yes  Other Topics Concern   Not on file  Social History Narrative   Not on file   Social Determinants of Health   Financial Resource Strain: Not on file  Food Insecurity: Not on file  Transportation Needs: Not on file  Physical Activity: Not on file  Stress: Not on file  Social Connections: Not on file  Intimate Partner Violence: Not on file     Constitutional: Denies fever, malaise, fatigue, headache or abrupt weight changes.  HEENT: Denies eye pain, eye redness, ear pain, ringing in the ears, wax buildup, runny nose, nasal congestion,  bloody nose, or sore throat. Respiratory: Denies difficulty breathing, shortness of breath, cough or sputum production.   Cardiovascular: Denies chest pain, chest tightness, palpitations or swelling in the hands or feet.  Gastrointestinal: Denies abdominal pain, bloating, constipation, diarrhea or blood in the stool.  GU: Denies urgency, frequency, pain with urination, burning sensation, blood in urine, odor or discharge. Musculoskeletal: Patient reports chronic left shoulder pain.  Denies decrease in range of motion, difficulty with gait, muscle pain or joint swelling.  Skin: Denies redness, rashes, lesions or ulcercations.  Neurological: Denies dizziness, difficulty with memory, difficulty with speech or problems with balance and coordination.  Psych: Denies anxiety, depression, SI/HI.  No other specific complaints in a complete review of  systems (except as listed in HPI above).  Objective:   Physical Exam   There were no vitals taken for this visit. Wt Readings from Last 3 Encounters:  11/04/22 209 lb (94.8 kg)  10/19/22 217 lb (98.4 kg)  02/24/22 218 lb (98.9 kg)    General: Appears their stated age, well developed, well nourished in NAD. Skin: Warm, dry and intact. No rashes, lesions or ulcerations noted. HEENT: Head: normal shape and size; Eyes: sclera white, no icterus, conjunctiva pink, PERRLA and EOMs intact; Ears: Tm's gray and intact, normal light reflex; Nose: mucosa pink and moist, septum midline; Throat/Mouth: Teeth present, mucosa pink and moist, no exudate, lesions or ulcerations noted.  Neck:  Neck supple, trachea midline. No masses, lumps or thyromegaly present.  Cardiovascular: Normal rate and rhythm. S1,S2 noted.  No murmur, rubs or gallops noted. No JVD or BLE edema. No carotid bruits noted. Pulmonary/Chest: Normal effort and positive vesicular breath sounds. No respiratory distress. No wheezes, rales or ronchi noted.  Abdomen: Soft and nontender. Normal bowel sounds. No distention or masses noted. Liver, spleen and kidneys non palpable. Musculoskeletal: Normal range of motion. No signs of joint swelling. No difficulty with gait.  Neurological: Alert and oriented. Cranial nerves II-XII grossly intact. Coordination normal.  Psychiatric: Mood and affect normal. Behavior is normal. Judgment and thought content normal.    BMET    Component Value Date/Time   NA 145 11/04/2022 0903   K 4.1 11/04/2022 0903   CL 106 11/04/2022 0903   CO2 23 11/04/2022 0903   GLUCOSE 97 11/04/2022 0903   BUN 15 11/04/2022 0903   CREATININE 0.62 11/04/2022 0903   CALCIUM 9.5 11/04/2022 0903   GFRNONAA >60 10/11/2022 1349   GFRNONAA 109 07/03/2019 0933   GFRAA >60 12/04/2019 1450   GFRAA 126 07/03/2019 0933    Lipid Panel     Component Value Date/Time   CHOL 194 11/04/2022 0903   TRIG 133 11/04/2022 0903   HDL 75  11/04/2022 0903   CHOLHDL 2.6 11/04/2022 0903   LDLCALC 96 11/04/2022 0903    CBC    Component Value Date/Time   WBC 6.9 10/11/2022 1349   RBC 4.20 10/11/2022 1349   HGB 12.3 10/11/2022 1349   HCT 38.0 10/11/2022 1349   PLT 267 10/11/2022 1349   MCV 90.5 10/11/2022 1349   MCH 29.3 10/11/2022 1349   MCHC 32.4 10/11/2022 1349   RDW 12.2 10/11/2022 1349   LYMPHSABS 1,749 07/03/2019 0933   MONOABS 0.4 02/20/2018 1601   EOSABS 59 07/03/2019 0933   BASOSABS 20 07/03/2019 0933    Hgb A1C Lab Results  Component Value Date   HGBA1C 5.6 11/04/2022           Assessment & Plan:   Preventative health  maintenance:  Flu Tetanus UTD Encouraged her to get her COVID-vaccine Discussed Shingrix vaccine, she will check coverage with her insurance, and schedule visit if she would like to have this done She no longer needs to screen for cervical cancer Mammogram ordered-she will call to schedule Colon screening UTD Encouraged her to consume a balanced diet and exercise regimen Advised her to see an eye doctor and dentist annually Recent labs reviewed  RTC in 6 months for follow-up of chronic conditions Nicki Reaper, NP

## 2023-03-30 ENCOUNTER — Other Ambulatory Visit: Payer: Self-pay | Admitting: Internal Medicine

## 2023-03-30 DIAGNOSIS — Z1231 Encounter for screening mammogram for malignant neoplasm of breast: Secondary | ICD-10-CM

## 2023-03-31 ENCOUNTER — Ambulatory Visit
Admission: RE | Admit: 2023-03-31 | Discharge: 2023-03-31 | Disposition: A | Discharge status: Home / Self Care | Payer: 59 | Source: Ambulatory Visit | Attending: Internal Medicine | Admitting: Internal Medicine

## 2023-03-31 DIAGNOSIS — Z1231 Encounter for screening mammogram for malignant neoplasm of breast: Secondary | ICD-10-CM

## 2023-04-14 ENCOUNTER — Encounter: Payer: Self-pay | Admitting: Internal Medicine

## 2023-04-14 ENCOUNTER — Other Ambulatory Visit: Payer: Self-pay

## 2023-04-14 ENCOUNTER — Ambulatory Visit (INDEPENDENT_AMBULATORY_CARE_PROVIDER_SITE_OTHER): Payer: 59 | Admitting: Internal Medicine

## 2023-04-14 VITALS — BP 138/78 | HR 76 | Ht 63.0 in | Wt 213.0 lb

## 2023-04-14 DIAGNOSIS — E66812 Obesity, class 2: Secondary | ICD-10-CM

## 2023-04-14 DIAGNOSIS — Z0001 Encounter for general adult medical examination with abnormal findings: Secondary | ICD-10-CM | POA: Diagnosis not present

## 2023-04-14 DIAGNOSIS — I10 Essential (primary) hypertension: Secondary | ICD-10-CM

## 2023-04-14 DIAGNOSIS — Z6837 Body mass index (BMI) 37.0-37.9, adult: Secondary | ICD-10-CM

## 2023-04-14 MED ORDER — POTASSIUM CHLORIDE CRYS ER 10 MEQ PO TBCR
10.0000 meq | EXTENDED_RELEASE_TABLET | Freq: Two times a day (BID) | ORAL | 1 refills | Status: DC
  Filled 2023-04-14: qty 90, 45d supply, fill #0

## 2023-04-14 MED ORDER — HYDROCHLOROTHIAZIDE 25 MG PO TABS
ORAL_TABLET | Freq: Every day | ORAL | 1 refills | Status: DC
  Filled 2023-04-14: qty 90, 90d supply, fill #0

## 2023-04-14 NOTE — Progress Notes (Signed)
Subjective:    Patient ID: Caroline Turner, female    DOB: 07-12-1970, 52 y.o.   MRN: [id number]  HPI  Patient presents to clinic today for her annual exam.   Flu: 01/2023 Tetanus: 06/2018 COVID:  x 2 Shingrix: Never Pap smear: Hysterectomy Mammogram: 03/2023 Colon screening: 01/2022 Vision screening: as needed Dentist: as needed  Diet: She does eat meat. She consumes fruits and veggies. She tries to avoid fried foods. She drinks mostly water, soda, green tea Exercise: None  Review of Systems     Past Medical History:  Diagnosis Date   Heart murmur    pt reports she's had an echo to check this out & was found to be benign   History of swelling of feet    Hyperlipidemia    Hypertension    Kidney stones    PONV (postoperative nausea and vomiting)     Current Outpatient Medications  Medication Sig Dispense Refill   acetaminophen (TYLENOL) 500 MG tablet Take 1 tablet (500 mg total) by mouth every 6 (six) hours as needed. For use AFTER surgery 30 tablet 0   amoxicillin-clavulanate (AUGMENTIN) 875-125 MG tablet Take 1 tablet by mouth 2 (two) times daily. 20 tablet 0   aspirin EC 81 MG tablet Take 1 tablet (81 mg total) by mouth daily. Swallow whole. 30 tablet 12   Ergocalciferol (VITAMIN D2) 50 MCG (2000 UT) TABS Take by mouth.      fenofibrate 54 MG tablet Take 1 tablet (54 mg total) by mouth daily. (Patient not taking: Reported on 02/24/2022) 90 tablet 0   hydrochlorothiazide (HYDRODIURIL) 25 MG tablet TAKE 1 TABLET BY MOUTH DAILY 90 tablet 0   potassium chloride (KLOR-CON M) 10 MEQ tablet Take 1 tablet (10 mEq total) by mouth 2 (two) times daily. 90 tablet 1   No current facility-administered medications for this visit.    No Known Allergies  Family History  Problem Relation Age of Onset   Cancer Father        lung, liver, mets to brain   Dementia Father    Vascular Disease Mother    Dementia Mother        vascular dementia   Alzheimer's disease Mother      Social History   Socioeconomic History   Marital status: Married    Spouse name: Not on file   Number of children: 3   Years of education: Not on file   Highest education level: Master's degree (e.g., MA, MS, MEng, MEd, MSW, MBA)  Occupational History   Not on file  Tobacco Use   Smoking status: Some Days    Current packs/day: 0.10    Types: Cigarettes   Smokeless tobacco: Never   Tobacco comments:    5 cigarettes a week  Vaping Use   Vaping status: Never Used  Substance and Sexual Activity   Alcohol use: Yes    Alcohol/week: 1.0 standard drink of alcohol    Types: 1 Cans of beer per week    Comment: occasionally   Drug use: Never   Sexual activity: Yes  Other Topics Concern   Not on file  Social History Narrative   Not on file   Social Drivers of Health   Financial Resource Strain: Not on file  Food Insecurity: Not on file  Transportation Needs: Not on file  Physical Activity: Not on file  Stress: Not on file  Social Connections: Not on file  Intimate Partner Violence: Not on file  Constitutional:   Denies fever, fatigue, malaise, headache or abrupt weight changes.  HEENT: Denies eye pain, eye redness, ear pain, ringing in the ears, wax buildup, runny nose, nasal congestion, bloody nose, or sore throat. Respiratory: Denies difficulty breathing, shortness of breath, cough or sputum production.   Cardiovascular: Denies chest pain, chest tightness, palpitations or swelling in the hands or feet.  Gastrointestinal:  Denies abdominal pain, bloating, constipation, diarrhea or blood in the stool.  GU: Denies urgency, frequency, pain with urination, burning sensation, blood in urine, odor or discharge. Musculoskeletal: Denies decrease in range of motion, difficulty with gait, muscle pain or joint pain or swelling.  Skin: Denies redness, rashes, lesions or ulcercations.  Neurological: Denies dizziness, difficulty with memory, difficulty with speech or problems with  balance and coordination.  Psych: Patient has a history of depression.  Denies anxiety, SI/HI.  No other specific complaints in a complete review of systems (except as listed in HPI above).  Objective:   Physical Exam  BP 138/78   Pulse 76   Ht 5\' 3"  (1.6 m)   Wt 213 lb (96.6 kg)   SpO2 96%   BMI 37.73 kg/m    Wt Readings from Last 3 Encounters:  11/04/22 209 lb (94.8 kg)  10/19/22 217 lb (98.4 kg)  02/24/22 218 lb (98.9 kg)    General: Appears her stated age, obese, in NAD. Skin: Warm, dry and intact.  HEENT: Head: normal shape and size; Eyes: sclera white, no icterus, conjunctiva pink, PERRLA and EOMs intact;  Neck:  Neck supple, trachea midline. No masses, lumps or thyromegaly present.  Cardiovascular: Normal rate and rhythm. S1,S2 noted.  No murmur, rubs or gallops noted. No JVD or BLE edema. No carotid bruits noted. Pulmonary/Chest: Normal effort and positive vesicular breath sounds. No respiratory distress. No wheezes, rales or ronchi noted.  Abdomen: Normal bowel sounds.  Musculoskeletal: Strength 5/5 BUE/BLE.  No difficulty with gait.  Neurological: Alert and oriented. Cranial nerves II-XII grossly intact. Coordination normal.  Psychiatric: Mood and affect normal. Behavior is normal. Judgment and thought content normal.    BMET    Component Value Date/Time   NA 145 11/04/2022 0903   K 4.1 11/04/2022 0903   CL 106 11/04/2022 0903   CO2 23 11/04/2022 0903   GLUCOSE 97 11/04/2022 0903   BUN 15 11/04/2022 0903   CREATININE 0.62 11/04/2022 0903   CALCIUM 9.5 11/04/2022 0903   GFRNONAA >60 10/11/2022 1349   GFRNONAA 109 07/03/2019 0933   GFRAA >60 12/04/2019 1450   GFRAA 126 07/03/2019 0933    Lipid Panel     Component Value Date/Time   CHOL 194 11/04/2022 0903   TRIG 133 11/04/2022 0903   HDL 75 11/04/2022 0903   CHOLHDL 2.6 11/04/2022 0903   LDLCALC 96 11/04/2022 0903    CBC    Component Value Date/Time   WBC 6.9 10/11/2022 1349   RBC 4.20  10/11/2022 1349   HGB 12.3 10/11/2022 1349   HCT 38.0 10/11/2022 1349   PLT 267 10/11/2022 1349   MCV 90.5 10/11/2022 1349   MCH 29.3 10/11/2022 1349   MCHC 32.4 10/11/2022 1349   RDW 12.2 10/11/2022 1349   LYMPHSABS 1,749 07/03/2019 0933   MONOABS 0.4 02/20/2018 1601   EOSABS 59 07/03/2019 0933   BASOSABS 20 07/03/2019 0933    Hgb A1C Lab Results  Component Value Date   HGBA1C 5.6 11/04/2022            Assessment & Plan:  Preventative Health Maintenance:  Flu shot UTD Tetanus UTD Encouraged her to get her COVID-vaccine Discussed Shingrix vaccine, she will check coverage with her insurance company and schedule a nurse visit if she would like to have this done She no longer needs Pap smears Mammogram UTD Colon screening UTD Encouraged her to consume a balanced diet and exercise regimen Advised her to see an eye doctor and dentist annually She declines labs today   RTC in 6 months, follow-up chronic conditions Nicki Reaper, NP

## 2023-04-14 NOTE — Patient Instructions (Signed)

## 2023-04-14 NOTE — Assessment & Plan Note (Signed)
Encouraged diet and exercise for weight loss ?

## 2023-10-14 ENCOUNTER — Ambulatory Visit: Payer: Self-pay | Admitting: Internal Medicine

## 2023-10-14 NOTE — Progress Notes (Deleted)
 Subjective:    Patient ID: Caroline Turner, female    DOB: May 24, 1970, 53 y.o.   MRN: [id number]  HPI  Patient presents to clinic today for follow-up of chronic conditions.  HTN: Her BP today is 122/84.  She is taking HCTZ and potassium as prescribed.  ECG from 09/2022 reviewed.  GERD: Triggered by spicy foods.  She is taking tums as needed with good relief of symptoms.  She has been on pantoprazole  in the past.  Upper GI from 01/2022 reviewed.  HLD with aortic atherosclerosis: Her last LDL was 96, triglycerides 409, 09/2022.  She is not taking any cholesterol-lowering medication at this time.  She is taking fenofibrate .  She is taking aspirin  daily.  She tries to consume a low-fat diet.  Chronic left shoulder pain: Status post labral repair 2013.  She takes ibuprofen  or naproxen OTC with some relief of symptoms. She intermittently gets shoulder injections. She is following with orthopedics.  Review of Systems     Past Medical History:  Diagnosis Date   Heart murmur    pt reports she's had an echo to check this out & was found to be benign   History of swelling of feet    Hyperlipidemia    Hypertension    Kidney stones    PONV (postoperative nausea and vomiting)     Current Outpatient Medications  Medication Sig Dispense Refill   acetaminophen  (TYLENOL ) 500 MG tablet Take 1 tablet (500 mg total) by mouth every 6 (six) hours as needed. For use AFTER surgery 30 tablet 0   aspirin  EC 81 MG tablet Take 1 tablet (81 mg total) by mouth daily. Swallow whole. 30 tablet 12   Ergocalciferol  (VITAMIN D2) 50 MCG (2000 UT) TABS Take by mouth.      fenofibrate  54 MG tablet Take 1 tablet (54 mg total) by mouth daily. 90 tablet 0   hydrochlorothiazide  (HYDRODIURIL ) 25 MG tablet TAKE 1 TABLET BY MOUTH DAILY 90 tablet 1   potassium chloride  (KLOR-CON  M) 10 MEQ tablet Take 1 tablet (10 mEq total) by mouth 2 (two) times daily. 90 tablet 1   No current facility-administered medications for this  visit.    No Known Allergies  Family History  Problem Relation Age of Onset   Cancer Father        lung, liver, mets to brain   Dementia Father    Vascular Disease Mother    Dementia Mother        vascular dementia   Alzheimer's disease Mother     Social History   Socioeconomic History   Marital status: Married    Spouse name: Not on file   Number of children: 3   Years of education: Not on file   Highest education level: Master's degree (e.g., MA, MS, MEng, MEd, MSW, MBA)  Occupational History   Not on file  Tobacco Use   Smoking status: Some Days    Current packs/day: 0.10    Types: Cigarettes   Smokeless tobacco: Never   Tobacco comments:    5 cigarettes a week  Vaping Use   Vaping status: Never Used  Substance and Sexual Activity   Alcohol use: Yes    Alcohol/week: 1.0 standard drink of alcohol    Types: 1 Cans of beer per week    Comment: occasionally   Drug use: Never   Sexual activity: Yes  Other Topics Concern   Not on file  Social History Narrative   Not on file  Social Drivers of Corporate investment banker Strain: Not on file  Food Insecurity: Not on file  Transportation Needs: Not on file  Physical Activity: Not on file  Stress: Not on file  Social Connections: Not on file  Intimate Partner Violence: Not on file     Constitutional: Denies fever, malaise, fatigue, headache or abrupt weight changes.  HEENT: Denies eye pain, eye redness, ear pain, ringing in the ears, wax buildup, runny nose, nasal congestion, bloody nose, or sore throat. Respiratory: Denies difficulty breathing, shortness of breath, cough or sputum production.   Cardiovascular: Denies chest tightness, chest pain, palpitations or swelling in the hands or feet.  Gastrointestinal: Patient reports intermittent reflux.  Denies abdominal pain, bloating, constipation, diarrhea or blood in the stool.  GU: Denies urgency, frequency, pain with urination, burning sensation, blood in  urine, odor or discharge. Musculoskeletal: Patient reports chronic left shoulder pain, decrease in range of motion and muscle cramps.  Denies decrease in range of motion, difficulty with gait, or joint swelling.  Skin: Denies redness, rashes, lesions or ulcercations.  Neurological: Denies dizziness, difficulty with memory, difficulty with speech or problems with balance and coordination.  Psych: Denies anxiety, depression, SI/HI.  No other specific complaints in a complete review of systems (except as listed in HPI above).  Objective:   Physical Exam There were no vitals taken for this visit.  Wt Readings from Last 3 Encounters:  04/14/23 213 lb (96.6 kg)  11/04/22 209 lb (94.8 kg)  10/19/22 217 lb (98.4 kg)    General: Appears her stated age, obese, in NAD. Skin: Warm, dry and intact.  HEENT: Head: normal shape and size; Eyes: sclera white, no icterus, conjunctiva pink, PERRLA and EOMs intact;  Cardiovascular: Normal rate and rhythm. S1,S2 noted.  No murmur, rubs or gallops noted. No JVD.  Trace nonpitting BLE edema. No carotid bruits noted. Pulmonary/Chest: Normal effort and positive vesicular breath sounds. No respiratory distress. No wheezes, rales or ronchi noted.  Abdomen: Soft and nontender. Normal bowel sounds.  Musculoskeletal: Decreased external and internal rotation of the left shoulder.  No pain with palpation of the left shoulder.  Negative drop can test on the left.  No difficulty with gait.  Neurological: Alert and oriented. Coordination normal.  Psychiatric: Mood and affect normal. Behavior is normal. Judgment and thought content normal.     BMET    Component Value Date/Time   NA 145 11/04/2022 0903   K 4.1 11/04/2022 0903   CL 106 11/04/2022 0903   CO2 23 11/04/2022 0903   GLUCOSE 97 11/04/2022 0903   BUN 15 11/04/2022 0903   CREATININE 0.62 11/04/2022 0903   CALCIUM 9.5 11/04/2022 0903   GFRNONAA >60 10/11/2022 1349   GFRNONAA 109 07/03/2019 0933   GFRAA  >60 12/04/2019 1450   GFRAA 126 07/03/2019 0933    Lipid Panel     Component Value Date/Time   CHOL 194 11/04/2022 0903   TRIG 133 11/04/2022 0903   HDL 75 11/04/2022 0903   CHOLHDL 2.6 11/04/2022 0903   LDLCALC 96 11/04/2022 0903    CBC    Component Value Date/Time   WBC 6.9 10/11/2022 1349   RBC 4.20 10/11/2022 1349   HGB 12.3 10/11/2022 1349   HCT 38.0 10/11/2022 1349   PLT 267 10/11/2022 1349   MCV 90.5 10/11/2022 1349   MCH 29.3 10/11/2022 1349   MCHC 32.4 10/11/2022 1349   RDW 12.2 10/11/2022 1349   LYMPHSABS 1,749 07/03/2019 0933   MONOABS  0.4 02/20/2018 1601   EOSABS 59 07/03/2019 0933   BASOSABS 20 07/03/2019 0933    Hgb A1C Lab Results  Component Value Date   HGBA1C 5.6 11/04/2022            Assessment & Plan:    RTC in 6 months for your annual exam Helayne Lo, NP

## 2024-02-15 ENCOUNTER — Other Ambulatory Visit: Payer: Self-pay

## 2024-05-08 ENCOUNTER — Ambulatory Visit (INDEPENDENT_AMBULATORY_CARE_PROVIDER_SITE_OTHER): Admitting: Internal Medicine

## 2024-05-08 ENCOUNTER — Encounter: Payer: Self-pay | Admitting: Internal Medicine

## 2024-05-08 ENCOUNTER — Other Ambulatory Visit: Payer: Self-pay

## 2024-05-08 VITALS — BP 130/78 | Ht 63.0 in | Wt 222.8 lb

## 2024-05-08 DIAGNOSIS — I10 Essential (primary) hypertension: Secondary | ICD-10-CM

## 2024-05-08 DIAGNOSIS — E781 Pure hyperglyceridemia: Secondary | ICD-10-CM

## 2024-05-08 DIAGNOSIS — Z1231 Encounter for screening mammogram for malignant neoplasm of breast: Secondary | ICD-10-CM | POA: Diagnosis not present

## 2024-05-08 DIAGNOSIS — R739 Hyperglycemia, unspecified: Secondary | ICD-10-CM

## 2024-05-08 DIAGNOSIS — Z0001 Encounter for general adult medical examination with abnormal findings: Secondary | ICD-10-CM

## 2024-05-08 MED ORDER — HYDROCHLOROTHIAZIDE 25 MG PO TABS
25.0000 mg | ORAL_TABLET | Freq: Every day | ORAL | 1 refills | Status: AC
  Filled 2024-05-08: qty 90, 90d supply, fill #0

## 2024-05-08 MED ORDER — POTASSIUM CHLORIDE CRYS ER 10 MEQ PO TBCR
10.0000 meq | EXTENDED_RELEASE_TABLET | Freq: Two times a day (BID) | ORAL | 1 refills | Status: AC
  Filled 2024-05-08: qty 90, 45d supply, fill #0

## 2024-05-08 NOTE — Patient Instructions (Signed)
 Health Maintenance for Postmenopausal Women Menopause is a normal process in which your ability to get pregnant comes to an end. This process happens slowly over many months or years, usually between the ages of 56 and 38. Menopause is complete when you have missed your menstrual period for 12 months. It is important to talk with your health care provider about some of the most common conditions that affect women after menopause (postmenopausal women). These include heart disease, cancer, and bone loss (osteoporosis). Adopting a healthy lifestyle and getting preventive care can help to promote your health and wellness. The actions you take can also lower your chances of developing some of these common conditions. What are the signs and symptoms of menopause? During menopause, you may have the following symptoms: Hot flashes. These can be moderate or severe. Night sweats. Decrease in sex drive. Mood swings. Headaches. Tiredness (fatigue). Irritability. Memory problems. Problems falling asleep or staying asleep. Talk with your health care provider about treatment options for your symptoms. Do I need hormone replacement therapy? Hormone replacement therapy is effective in treating symptoms that are caused by menopause, such as hot flashes and night sweats. Hormone replacement carries certain risks, especially as you become older. If you are thinking about using estrogen or estrogen with progestin, discuss the benefits and risks with your health care provider. How can I reduce my risk for heart disease and stroke? The risk of heart disease, heart attack, and stroke increases as you age. One of the causes may be a change in the body's hormones during menopause. This can affect how your body uses dietary fats, triglycerides, and cholesterol. Heart attack and stroke are medical emergencies. There are many things that you can do to help prevent heart disease and stroke. Watch your blood pressure High  blood pressure causes heart disease and increases the risk of stroke. This is more likely to develop in people who have high blood pressure readings or are overweight. Have your blood pressure checked: Every 3-5 years if you are 56-23 years of age. Every year if you are 56 years old or older. Eat a healthy diet  Eat a diet that includes plenty of vegetables, fruits, low-fat dairy products, and lean protein. Do not eat a lot of foods that are high in solid fats, added sugars, or sodium. Get regular exercise Get regular exercise. This is one of the most important things you can do for your health. Most adults should: Try to exercise for at least 150 minutes each week. The exercise should increase your heart rate and make you sweat (moderate-intensity exercise). Try to do strengthening exercises at least twice each week. Do these in addition to the moderate-intensity exercise. Spend less time sitting. Even light physical activity can be beneficial. Other tips Work with your health care provider to achieve or maintain a healthy weight. Do not use any products that contain nicotine or tobacco. These products include cigarettes, chewing tobacco, and vaping devices, such as e-cigarettes. If you need help quitting, ask your health care provider. Know your numbers. Ask your health care provider to check your cholesterol and your blood sugar (glucose). Continue to have your blood tested as directed by your health care provider. Do I need screening for cancer? Depending on your health history and family history, you may need to have cancer screenings at different stages of your life. This may include screening for: Breast cancer. Cervical cancer. Lung cancer. Colorectal cancer. What is my risk for osteoporosis? After menopause, you may be  at increased risk for osteoporosis. Osteoporosis is a condition in which bone destruction happens more quickly than new bone creation. To help prevent osteoporosis or  the bone fractures that can happen because of osteoporosis, you may take the following actions: If you are 56-54 years old, get at least 1,000 mg of calcium and at least 600 international units (IU) of vitamin D  per day. If you are older than age 75 but younger than age 30, get at least 1,200 mg of calcium and at least 600 international units (IU) of vitamin D  per day. If you are older than age 8, get at least 1,200 mg of calcium and at least 800 international units (IU) of vitamin D  per day. Smoking and drinking excessive alcohol increase the risk of osteoporosis. Eat foods that are rich in calcium and vitamin D , and do weight-bearing exercises several times each week as directed by your health care provider. How does menopause affect my mental health? Depression may occur at any age, but it is more common as you become older. Common symptoms of depression include: Feeling depressed. Changes in sleep patterns. Changes in appetite or eating patterns. Feeling an overall lack of motivation or enjoyment of activities that you previously enjoyed. Frequent crying spells. Talk with your health care provider if you think that you are experiencing any of these symptoms. General instructions See your health care provider for regular wellness exams and vaccines. This may include: Scheduling regular health, dental, and eye exams. Getting and maintaining your vaccines. These include: Influenza vaccine. Get this vaccine each year before the flu season begins. Pneumonia vaccine. Shingles vaccine. Tetanus, diphtheria, and pertussis (Tdap) booster vaccine. Your health care provider may also recommend other immunizations. Tell your health care provider if you have ever been abused or do not feel safe at home. Summary Menopause is a normal process in which your ability to get pregnant comes to an end. This condition causes hot flashes, night sweats, decreased interest in sex, mood swings, headaches, or lack  of sleep. Treatment for this condition may include hormone replacement therapy. Take actions to keep yourself healthy, including exercising regularly, eating a healthy diet, watching your weight, and checking your blood pressure and blood sugar levels. Get screened for cancer and depression. Make sure that you are up to date with all your vaccines. This information is not intended to replace advice given to you by your health care provider. Make sure you discuss any questions you have with your health care provider. Document Revised: 09/01/2020 Document Reviewed: 09/01/2020 Elsevier Patient Education  2024 [employer].

## 2024-05-08 NOTE — Assessment & Plan Note (Signed)
 Encouraged diet and exercise for weight loss ?

## 2024-05-08 NOTE — Progress Notes (Signed)
 "  Subjective:    Patient ID: Dickey Jenkins Lyme, female    DOB: 1971-01-04, 54 y.o.   MRN: [id number]  HPI  Patient presents to clinic today for her annual exam.  She has had some difficulty hearing.  This is been a progressive issue.  She reports hearing loss due to recurrent ear infections as a child.  Her left ear seems worse than her right ear.  She does have an appointment with audiology tomorrow.  Flu: 01/2023 Tetanus: 06/2018 COVID:  x 2 Shingrix: Never Pap smear: Hysterectomy Mammogram: 03/2023 Colon screening: 01/2022 Vision screening: as needed Dentist: as needed  Diet: She does eat meat. She consumes fruits and veggies. She tries to avoid fried foods. She drinks mostly water , soda, green tea Exercise: None  Review of Systems     Past Medical History:  Diagnosis Date   Heart murmur    pt reports she's had an echo to check this out & was found to be benign   History of swelling of feet    Hyperlipidemia    Hypertension    Kidney stones    PONV (postoperative nausea and vomiting)     Current Outpatient Medications  Medication Sig Dispense Refill   acetaminophen  (TYLENOL ) 500 MG tablet Take 1 tablet (500 mg total) by mouth every 6 (six) hours as needed. For use AFTER surgery 30 tablet 0   aspirin  EC 81 MG tablet Take 1 tablet (81 mg total) by mouth daily. Swallow whole. 30 tablet 12   Ergocalciferol  (VITAMIN D2) 50 MCG (2000 UT) TABS Take by mouth.      fenofibrate  54 MG tablet Take 1 tablet (54 mg total) by mouth daily. 90 tablet 0   hydrochlorothiazide  (HYDRODIURIL ) 25 MG tablet TAKE 1 TABLET BY MOUTH DAILY 90 tablet 1   potassium chloride  (KLOR-CON  M) 10 MEQ tablet Take 1 tablet (10 mEq total) by mouth 2 (two) times daily. 90 tablet 1   No current facility-administered medications for this visit.    No Known Allergies  Family History  Problem Relation Age of Onset   Cancer Father        lung, liver, mets to brain   Dementia Father    Vascular Disease Mother     Dementia Mother        vascular dementia   Alzheimer's disease Mother     Social History   Socioeconomic History   Marital status: Married    Spouse name: Not on file   Number of children: 3   Years of education: Not on file   Highest education level: Master's degree (e.g., MA, MS, MEng, MEd, MSW, MBA)  Occupational History   Not on file  Tobacco Use   Smoking status: Some Days    Current packs/day: 0.10    Types: Cigarettes   Smokeless tobacco: Never   Tobacco comments:    5 cigarettes a week  Vaping Use   Vaping status: Never Used  Substance and Sexual Activity   Alcohol use: Yes    Alcohol/week: 1.0 standard drink of alcohol    Types: 1 Cans of beer per week    Comment: occasionally   Drug use: Never   Sexual activity: Yes  Other Topics Concern   Not on file  Social History Narrative   Not on file   Social Drivers of Health   Tobacco Use: High Risk (04/14/2023)   Patient History    Smoking Tobacco Use: Some Days    Smokeless Tobacco Use:  Never    Passive Exposure: Not on file  Financial Resource Strain: Not on file  Food Insecurity: Not on file  Transportation Needs: Not on file  Physical Activity: Not on file  Stress: Not on file  Social Connections: Not on file  Intimate Partner Violence: Not on file  Depression (PHQ2-9): Medium Risk (04/14/2023)   Depression (PHQ2-9)    PHQ-2 Score: 6  Alcohol Screen: Low Risk (10/19/2022)   Alcohol Screen    Last Alcohol Screening Score (AUDIT): 3  Housing: Not on file  Utilities: Not on file  Health Literacy: Not on file     Constitutional:   Denies fever, fatigue, malaise, headache or abrupt weight changes.  HEENT: Pt reports difficulty hearing. Denies eye pain, eye redness, ear pain, ringing in the ears, wax buildup, runny nose, nasal congestion, bloody nose, or sore throat. Respiratory: Denies difficulty breathing, shortness of breath, cough or sputum production.   Cardiovascular: Denies chest pain, chest  tightness, palpitations or swelling in the hands or feet.  Gastrointestinal:  Denies abdominal pain, bloating, constipation, diarrhea or blood in the stool.  GU: Denies urgency, frequency, pain with urination, burning sensation, blood in urine, odor or discharge. Musculoskeletal: Pt reports chronic left shoulder pain. Denies decrease in range of motion, difficulty with gait, muscle pain or joint swelling.  Skin: Denies redness, rashes, lesions or ulcercations.  Neurological: Patient reports hot flashes.  Denies dizziness, difficulty with memory, difficulty with speech or problems with balance and coordination.  Psych: Patient has a history of depression.  Denies anxiety, SI/HI.  No other specific complaints in a complete review of systems (except as listed in HPI above).  Objective:   Physical Exam  BP 130/78 (BP Location: Left Arm, Patient Position: Sitting, Cuff Size: Large)   Ht 5' 3 (1.6 m)   Wt 222 lb 12.8 oz (101.1 kg)   BMI 39.47 kg/m     Wt Readings from Last 3 Encounters:  04/14/23 213 lb (96.6 kg)  11/04/22 209 lb (94.8 kg)  10/19/22 217 lb (98.4 kg)    General: Appears her stated age, obese, in NAD. Skin: Warm, dry and intact.  HEENT: Head: normal shape and size; Eyes: sclera white, no icterus, conjunctiva pink, PERRLA and EOMs intact; ears: Scarring noted of bilateral TMs.  Abnormal Weber, lateralized to the left.  Abnormal Rinne on the left Neck:  Neck supple, trachea midline. No masses, lumps or thyromegaly present.  Cardiovascular: Normal rate and rhythm. S1,S2 noted.  No murmur, rubs or gallops noted. No JVD or BLE edema. No carotid bruits noted. Pulmonary/Chest: Normal effort and positive vesicular breath sounds. No respiratory distress. No wheezes, rales or ronchi noted.  Abdomen: Normal bowel sounds.  Musculoskeletal: Strength 5/5 BUE/BLE.  No difficulty with gait.  Neurological: Alert and oriented. Cranial nerves II-XII grossly intact. Coordination normal.   Psychiatric: Mood and affect normal. Behavior is normal. Judgment and thought content normal.    BMET    Component Value Date/Time   NA 145 11/04/2022 0903   K 4.1 11/04/2022 0903   CL 106 11/04/2022 0903   CO2 23 11/04/2022 0903   GLUCOSE 97 11/04/2022 0903   BUN 15 11/04/2022 0903   CREATININE 0.62 11/04/2022 0903   CALCIUM 9.5 11/04/2022 0903   GFRNONAA >60 10/11/2022 1349   GFRNONAA 109 07/03/2019 0933   GFRAA >60 12/04/2019 1450   GFRAA 126 07/03/2019 0933    Lipid Panel     Component Value Date/Time   CHOL 194 11/04/2022 0903  TRIG 133 11/04/2022 0903   HDL 75 11/04/2022 0903   CHOLHDL 2.6 11/04/2022 0903   LDLCALC 96 11/04/2022 0903    CBC    Component Value Date/Time   WBC 6.9 10/11/2022 1349   RBC 4.20 10/11/2022 1349   HGB 12.3 10/11/2022 1349   HCT 38.0 10/11/2022 1349   PLT 267 10/11/2022 1349   MCV 90.5 10/11/2022 1349   MCH 29.3 10/11/2022 1349   MCHC 32.4 10/11/2022 1349   RDW 12.2 10/11/2022 1349   LYMPHSABS 1,749 07/03/2019 0933   MONOABS 0.4 02/20/2018 1601   EOSABS 59 07/03/2019 0933   BASOSABS 20 07/03/2019 0933    Hgb A1C Lab Results  Component Value Date   HGBA1C 5.6 11/04/2022            Assessment & Plan:   Preventative Health Maintenance:  Flu shot declined Tetanus UTD Encouraged her to get her COVID-vaccine Discussed Shingrix vaccine, she will check coverage with her insurance company and schedule a nurse visit if she would like to have this done She no longer needs Pap smears Mammogram ordered- she will call to schedule Colon screening UTD Encouraged her to consume a balanced diet and exercise regimen Advised her to see an eye doctor and dentist annually Will check CBC, CMET, Lipid and A1C today  Difficulty hearing:  Hearing Screening   500Hz  1000Hz  2000Hz  4000Hz   Right ear 20 20 20  40  Left ear 25 25 25  40  Follow-up with audiology as previously scheduled  RTC in 6 months, follow-up chronic  conditions Angeline Laura, NP  "

## 2024-05-09 ENCOUNTER — Ambulatory Visit: Payer: Self-pay | Admitting: Internal Medicine

## 2024-05-09 LAB — COMPREHENSIVE METABOLIC PANEL WITH GFR
AG Ratio: 1.8 (calc) (ref 1.0–2.5)
ALT: 26 U/L (ref 6–29)
AST: 13 U/L (ref 10–35)
Albumin: 4.4 g/dL (ref 3.6–5.1)
Alkaline phosphatase (APISO): 82 U/L (ref 37–153)
BUN/Creatinine Ratio: 31 (calc) — ABNORMAL HIGH (ref 6–22)
BUN: 15 mg/dL (ref 7–25)
CO2: 28 mmol/L (ref 20–32)
Calcium: 9.2 mg/dL (ref 8.6–10.4)
Chloride: 102 mmol/L (ref 98–110)
Creat: 0.49 mg/dL — ABNORMAL LOW (ref 0.50–1.03)
Globulin: 2.4 g/dL (ref 1.9–3.7)
Glucose, Bld: 141 mg/dL — ABNORMAL HIGH (ref 65–139)
Potassium: 3.9 mmol/L (ref 3.5–5.3)
Sodium: 139 mmol/L (ref 135–146)
Total Bilirubin: 0.3 mg/dL (ref 0.2–1.2)
Total Protein: 6.8 g/dL (ref 6.1–8.1)
eGFR: 111 mL/min/1.73m2

## 2024-05-09 LAB — LIPID PANEL
Cholesterol: 178 mg/dL
HDL: 57 mg/dL
LDL Cholesterol (Calc): 86 mg/dL
Non-HDL Cholesterol (Calc): 121 mg/dL
Total CHOL/HDL Ratio: 3.1 (calc)
Triglycerides: 263 mg/dL — ABNORMAL HIGH

## 2024-05-09 LAB — CBC
HCT: 40.1 % (ref 35.9–46.0)
Hemoglobin: 13.4 g/dL (ref 11.7–15.5)
MCH: 30.3 pg (ref 27.0–33.0)
MCHC: 33.4 g/dL (ref 31.6–35.4)
MCV: 90.7 fL (ref 81.4–101.7)
MPV: 12.2 fL (ref 7.5–12.5)
Platelets: 261 Thousand/uL (ref 140–400)
RBC: 4.42 Million/uL (ref 3.80–5.10)
RDW: 11.7 % (ref 11.0–15.0)
WBC: 5.6 Thousand/uL (ref 3.8–10.8)

## 2024-05-09 LAB — HEMOGLOBIN A1C
Hgb A1c MFr Bld: 5.4 %
Mean Plasma Glucose: 108 mg/dL
eAG (mmol/L): 6 mmol/L

## 2024-05-10 ENCOUNTER — Other Ambulatory Visit: Payer: Self-pay

## 2024-05-14 ENCOUNTER — Other Ambulatory Visit: Payer: Self-pay

## 2024-05-14 MED ORDER — FENOFIBRATE 54 MG PO TABS
54.0000 mg | ORAL_TABLET | Freq: Every day | ORAL | 0 refills | Status: AC
  Filled 2024-05-14: qty 90, 90d supply, fill #0

## 2024-05-22 ENCOUNTER — Ambulatory Visit
Admission: RE | Admit: 2024-05-22 | Discharge: 2024-05-22 | Disposition: A | Source: Ambulatory Visit | Attending: Internal Medicine | Admitting: Internal Medicine

## 2024-05-22 DIAGNOSIS — Z1231 Encounter for screening mammogram for malignant neoplasm of breast: Secondary | ICD-10-CM

## 2024-05-28 ENCOUNTER — Encounter: Admitting: Internal Medicine

## 2024-11-07 ENCOUNTER — Ambulatory Visit: Admitting: Internal Medicine
